# Patient Record
Sex: Female | Born: 1985
Health system: Southern US, Community
[De-identification: ages and names within clinical notes are randomized; demographics above are authoritative.]

## PROBLEM LIST (undated history)

## (undated) DIAGNOSIS — E119 Type 2 diabetes mellitus without complications: Secondary | ICD-10-CM

## (undated) DIAGNOSIS — G510 Bell's palsy: Secondary | ICD-10-CM

## (undated) HISTORY — PX: CHOLECYSTECTOMY: SHX55

---
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
## 2004-01-13 ENCOUNTER — Emergency Department (HOSPITAL_COMMUNITY): Admission: EM | Admit: 2004-01-13 | Discharge: 2004-01-13 | Payer: Self-pay | Admitting: Emergency Medicine

## 2004-05-18 ENCOUNTER — Emergency Department (HOSPITAL_COMMUNITY): Admission: EM | Admit: 2004-05-18 | Discharge: 2004-05-18 | Payer: Self-pay | Admitting: Emergency Medicine

## 2004-06-04 ENCOUNTER — Inpatient Hospital Stay (HOSPITAL_COMMUNITY): Admission: AD | Admit: 2004-06-04 | Discharge: 2004-06-04 | Payer: Self-pay | Admitting: Obstetrics & Gynecology

## 2004-10-06 ENCOUNTER — Ambulatory Visit (HOSPITAL_COMMUNITY): Admission: RE | Admit: 2004-10-06 | Discharge: 2004-10-06 | Payer: Self-pay | Admitting: Cardiology

## 2004-11-25 ENCOUNTER — Inpatient Hospital Stay (HOSPITAL_COMMUNITY): Admission: AD | Admit: 2004-11-25 | Discharge: 2004-11-26 | Payer: Self-pay | Admitting: Obstetrics

## 2005-01-03 ENCOUNTER — Inpatient Hospital Stay (HOSPITAL_COMMUNITY): Admission: AD | Admit: 2005-01-03 | Discharge: 2005-01-03 | Payer: Self-pay | Admitting: Obstetrics

## 2005-01-19 ENCOUNTER — Inpatient Hospital Stay (HOSPITAL_COMMUNITY): Admission: AD | Admit: 2005-01-19 | Discharge: 2005-01-19 | Payer: Self-pay | Admitting: Obstetrics

## 2005-01-23 ENCOUNTER — Inpatient Hospital Stay (HOSPITAL_COMMUNITY): Admission: AD | Admit: 2005-01-23 | Discharge: 2005-01-23 | Payer: Self-pay | Admitting: Obstetrics

## 2005-02-02 ENCOUNTER — Inpatient Hospital Stay (HOSPITAL_COMMUNITY): Admission: AD | Admit: 2005-02-02 | Discharge: 2005-02-02 | Payer: Self-pay | Admitting: Obstetrics

## 2005-02-07 ENCOUNTER — Inpatient Hospital Stay (HOSPITAL_COMMUNITY): Admission: AD | Admit: 2005-02-07 | Discharge: 2005-02-10 | Payer: Self-pay | Admitting: Obstetrics

## 2005-06-01 ENCOUNTER — Emergency Department (HOSPITAL_COMMUNITY): Admission: EM | Admit: 2005-06-01 | Discharge: 2005-06-01 | Payer: Self-pay | Admitting: Emergency Medicine

## 2005-06-30 ENCOUNTER — Inpatient Hospital Stay (HOSPITAL_COMMUNITY): Admission: AD | Admit: 2005-06-30 | Discharge: 2005-06-30 | Payer: Self-pay | Admitting: Obstetrics & Gynecology

## 2005-11-20 ENCOUNTER — Inpatient Hospital Stay (HOSPITAL_COMMUNITY): Admission: AD | Admit: 2005-11-20 | Discharge: 2005-11-20 | Payer: Self-pay | Admitting: Obstetrics

## 2005-11-21 ENCOUNTER — Emergency Department (HOSPITAL_COMMUNITY): Admission: EM | Admit: 2005-11-21 | Discharge: 2005-11-21 | Payer: Self-pay | Admitting: Family Medicine

## 2005-12-19 ENCOUNTER — Inpatient Hospital Stay (HOSPITAL_COMMUNITY): Admission: AD | Admit: 2005-12-19 | Discharge: 2005-12-19 | Payer: Self-pay | Admitting: Obstetrics

## 2006-01-22 ENCOUNTER — Inpatient Hospital Stay (HOSPITAL_COMMUNITY): Admission: AD | Admit: 2006-01-22 | Discharge: 2006-01-22 | Payer: Self-pay | Admitting: Obstetrics

## 2006-02-02 ENCOUNTER — Inpatient Hospital Stay (HOSPITAL_COMMUNITY): Admission: AD | Admit: 2006-02-02 | Discharge: 2006-02-02 | Payer: Self-pay | Admitting: Obstetrics

## 2006-02-08 ENCOUNTER — Inpatient Hospital Stay (HOSPITAL_COMMUNITY): Admission: AD | Admit: 2006-02-08 | Discharge: 2006-02-08 | Payer: Self-pay | Admitting: Obstetrics

## 2006-02-17 ENCOUNTER — Inpatient Hospital Stay (HOSPITAL_COMMUNITY): Admission: AD | Admit: 2006-02-17 | Discharge: 2006-02-17 | Payer: Self-pay | Admitting: Obstetrics

## 2006-02-24 ENCOUNTER — Inpatient Hospital Stay (HOSPITAL_COMMUNITY): Admission: RE | Admit: 2006-02-24 | Discharge: 2006-02-26 | Payer: Self-pay | Admitting: Obstetrics

## 2006-04-07 ENCOUNTER — Emergency Department (HOSPITAL_COMMUNITY): Admission: EM | Admit: 2006-04-07 | Discharge: 2006-04-07 | Payer: Self-pay | Admitting: Emergency Medicine

## 2006-05-28 ENCOUNTER — Emergency Department (HOSPITAL_COMMUNITY): Admission: EM | Admit: 2006-05-28 | Discharge: 2006-05-28 | Payer: Self-pay | Admitting: Emergency Medicine

## 2006-09-25 ENCOUNTER — Emergency Department (HOSPITAL_COMMUNITY): Admission: EM | Admit: 2006-09-25 | Discharge: 2006-09-25 | Payer: Self-pay | Admitting: *Deleted

## 2006-10-19 ENCOUNTER — Encounter: Admission: RE | Admit: 2006-10-19 | Discharge: 2006-11-07 | Payer: Self-pay | Admitting: Sports Medicine

## 2007-04-12 ENCOUNTER — Inpatient Hospital Stay (HOSPITAL_COMMUNITY): Admission: AD | Admit: 2007-04-12 | Discharge: 2007-04-12 | Payer: Self-pay | Admitting: Obstetrics

## 2007-04-25 ENCOUNTER — Inpatient Hospital Stay (HOSPITAL_COMMUNITY): Admission: AD | Admit: 2007-04-25 | Discharge: 2007-04-25 | Payer: Self-pay | Admitting: Obstetrics

## 2007-07-14 ENCOUNTER — Inpatient Hospital Stay (HOSPITAL_COMMUNITY): Admission: AD | Admit: 2007-07-14 | Discharge: 2007-07-14 | Payer: Self-pay | Admitting: Obstetrics

## 2007-11-21 ENCOUNTER — Inpatient Hospital Stay (HOSPITAL_COMMUNITY): Admission: AD | Admit: 2007-11-21 | Discharge: 2007-11-21 | Payer: Self-pay | Admitting: Obstetrics

## 2007-12-13 ENCOUNTER — Inpatient Hospital Stay (HOSPITAL_COMMUNITY): Admission: RE | Admit: 2007-12-13 | Discharge: 2007-12-15 | Payer: Self-pay | Admitting: Obstetrics

## 2008-02-09 ENCOUNTER — Inpatient Hospital Stay (HOSPITAL_COMMUNITY): Admission: AD | Admit: 2008-02-09 | Discharge: 2008-02-09 | Payer: Self-pay | Admitting: Obstetrics

## 2008-07-12 ENCOUNTER — Emergency Department (HOSPITAL_COMMUNITY): Admission: EM | Admit: 2008-07-12 | Discharge: 2008-07-13 | Payer: Self-pay | Admitting: Emergency Medicine

## 2008-12-15 ENCOUNTER — Emergency Department (HOSPITAL_COMMUNITY): Admission: EM | Admit: 2008-12-15 | Discharge: 2008-12-15 | Payer: Self-pay | Admitting: Emergency Medicine

## 2010-06-10 LAB — CBC
HCT: 37.9 % (ref 36.0–46.0)
MCHC: 34.5 g/dL (ref 30.0–36.0)
MCV: 87.2 fL (ref 78.0–100.0)
Platelets: 265 10*3/uL (ref 150–400)
RDW: 12.9 % (ref 11.5–15.5)
WBC: 9.8 10*3/uL (ref 4.0–10.5)

## 2010-06-10 LAB — URINALYSIS, ROUTINE W REFLEX MICROSCOPIC
Glucose, UA: NEGATIVE mg/dL
Hgb urine dipstick: NEGATIVE
Ketones, ur: NEGATIVE mg/dL
Protein, ur: NEGATIVE mg/dL
Urobilinogen, UA: 0.2 mg/dL (ref 0.0–1.0)

## 2010-06-10 LAB — DIFFERENTIAL
Basophils Absolute: 0.2 10*3/uL — ABNORMAL HIGH (ref 0.0–0.1)
Eosinophils Relative: 6 % — ABNORMAL HIGH (ref 0–5)
Lymphocytes Relative: 27 % (ref 12–46)
Lymphs Abs: 2.6 10*3/uL (ref 0.7–4.0)
Monocytes Absolute: 0.7 10*3/uL (ref 0.1–1.0)
Monocytes Relative: 7 % (ref 3–12)
Neutro Abs: 5.7 10*3/uL (ref 1.7–7.7)

## 2010-06-10 LAB — COMPREHENSIVE METABOLIC PANEL
AST: 27 U/L (ref 0–37)
Albumin: 3.8 g/dL (ref 3.5–5.2)
BUN: 12 mg/dL (ref 6–23)
Calcium: 9.3 mg/dL (ref 8.4–10.5)
Chloride: 103 mEq/L (ref 96–112)
Creatinine, Ser: 0.98 mg/dL (ref 0.4–1.2)
GFR calc Af Amer: >60 mL/min (ref >60)
Total Bilirubin: 0.6 mg/dL (ref 0.3–1.2)
Total Protein: 7.1 g/dL (ref 6.0–8.3)

## 2010-06-15 LAB — URINALYSIS, ROUTINE W REFLEX MICROSCOPIC
Glucose, UA: NEGATIVE mg/dL
Hgb urine dipstick: NEGATIVE
Protein, ur: NEGATIVE mg/dL
Specific Gravity, Urine: 1.014 (ref 1.005–1.030)

## 2010-06-15 LAB — COMPREHENSIVE METABOLIC PANEL
Alkaline Phosphatase: 99 U/L (ref 39–117)
BUN: 8 mg/dL (ref 6–23)
Chloride: 105 mEq/L (ref 96–112)
Creatinine, Ser: 0.74 mg/dL (ref 0.4–1.2)
Glucose, Bld: 115 mg/dL — ABNORMAL HIGH (ref 70–99)
Potassium: 4 mEq/L (ref 3.5–5.1)
Total Bilirubin: 0.7 mg/dL (ref 0.3–1.2)

## 2010-06-15 LAB — URINE MICROSCOPIC-ADD ON

## 2010-06-15 LAB — LIPASE, BLOOD: Lipase: 18 U/L (ref 11–59)

## 2010-06-15 LAB — D-DIMER, QUANTITATIVE: D-Dimer, Quant: <0.22 ug/mL-FEU (ref 0.00–0.48)

## 2010-11-26 LAB — URINALYSIS, ROUTINE W REFLEX MICROSCOPIC
Bilirubin Urine: NEGATIVE
Ketones, ur: NEGATIVE
Nitrite: NEGATIVE
Protein, ur: NEGATIVE
Urobilinogen, UA: 0.2

## 2010-12-01 LAB — URINALYSIS, ROUTINE W REFLEX MICROSCOPIC
Bilirubin Urine: NEGATIVE
Glucose, UA: NEGATIVE
Hgb urine dipstick: NEGATIVE
Nitrite: NEGATIVE
Specific Gravity, Urine: 1.015
pH: 5.5

## 2010-12-06 LAB — CBC
HCT: 31.8 — ABNORMAL LOW
HCT: 36
Hemoglobin: 12.1
MCHC: 33.5
MCV: 83.6
MCV: 83.9
Platelets: 208
RBC: 4.31
WBC: 8.4

## 2011-05-02 ENCOUNTER — Encounter (HOSPITAL_BASED_OUTPATIENT_CLINIC_OR_DEPARTMENT_OTHER): Payer: Self-pay

## 2011-05-02 ENCOUNTER — Emergency Department (HOSPITAL_BASED_OUTPATIENT_CLINIC_OR_DEPARTMENT_OTHER)
Admission: EM | Admit: 2011-05-02 | Discharge: 2011-05-02 | Disposition: A | Discharge status: Home / Self Care | Payer: Self-pay | Attending: Emergency Medicine | Admitting: Emergency Medicine

## 2011-05-02 DIAGNOSIS — S90416A Abrasion, unspecified lesser toe(s), initial encounter: Secondary | ICD-10-CM

## 2011-05-02 DIAGNOSIS — IMO0002 Reserved for concepts with insufficient information to code with codable children: Secondary | ICD-10-CM | POA: Insufficient documentation

## 2011-05-02 DIAGNOSIS — Y92009 Unspecified place in unspecified non-institutional (private) residence as the place of occurrence of the external cause: Secondary | ICD-10-CM | POA: Insufficient documentation

## 2011-05-02 DIAGNOSIS — X58XXXA Exposure to other specified factors, initial encounter: Secondary | ICD-10-CM | POA: Insufficient documentation

## 2011-05-02 MED ORDER — HYDROCODONE-ACETAMINOPHEN 5-325 MG PO TABS: 2.0000 | ORAL_TABLET | ORAL | Status: AC | PRN

## 2011-05-02 NOTE — ED Notes (Signed)
rx x 1 for hydrocodone- d/c home with ride

## 2011-05-02 NOTE — ED Notes (Signed)
Injury to 4th toe on right foot.

## 2011-05-02 NOTE — ED Provider Notes (Signed)
AndHistory/physical exam/procedure(s) were performed by non-physician practitioner and as supervising physician I was immediately available for consultation/collaboration. I have reviewed all notes and am in agreement with care and plan.   Hilario Quarry, MD 05/02/11 2139

## 2011-05-02 NOTE — ED Provider Notes (Signed)
History     CSN: [id number]  Arrival date & time 05/02/11  1604   First MD Initiated Contact with Patient 05/02/11 1612      Chief Complaint  Patient presents with  . Toe Injury    (Consider location/radiation/quality/duration/timing/severity/associated sxs/prior treatment) Patient is a 26 y.o. female presenting with foot injury. The history is provided by the patient. No language interpreter was used.  Foot Injury  The incident occurred 6 to 12 hours ago. The incident occurred at home. The injury mechanism was a direct blow. The pain is present in the right toes. The quality of the pain is described as aching. The pain is at a severity of 7/10. The pain has been constant since onset. She reports no foreign bodies present. The symptoms are aggravated by nothing. She has tried nothing for the symptoms. The treatment provided no relief.    History reviewed. No pertinent past medical history.  Past Surgical History  Procedure Date  . Cholecystectomy     No family history on file.  History  Substance Use Topics  . Smoking status: Never Smoker   . Smokeless tobacco: Never Used  . Alcohol Use: Yes     occasional    OB History    Grav Para Term Preterm Abortions TAB SAB Ect Mult Living                  Review of Systems  Skin: Positive for wound.  All other systems reviewed and are negative.    Allergies  Review of patient's allergies indicates no known allergies.  Home Medications  No current outpatient prescriptions on file.  BP 128/77  Pulse 75  Temp(Src) 98.2 F (36.8 C) (Oral)  Resp 16  Ht 5\' 9"  (1.753 m)  Wt 253 lb (114.76 kg)  BMI 37.36 kg/m2  SpO2 100%  LMP 03/27/2011  Physical Exam  Nursing note and vitals reviewed. Constitutional: She is oriented to person, place, and time. She appears well-developed and well-nourished.  HENT:  Head: Normocephalic.  Musculoskeletal: She exhibits tenderness.       Swollen right 4th toe, large bump under toe  nail,  Toenail deformed  Neurological: She is alert and oriented to person, place, and time. She has normal reflexes.  Psychiatric: She has a normal mood and affect.    ED Course  Procedures (including critical care time)  Labs Reviewed - No data to display No results found.   No diagnosis found.  I advised pt to call Dr. Charlsie Merles for evaluation of growth under toe.    MDM        Langston Masker, [place] 05/02/11 [phone]

## 2011-05-02 NOTE — Discharge Instructions (Signed)
Abrasions  Abrasions are skin scrapes. Their treatment depends on how large and deep the abrasion is. Abrasions do not extend through all layers of the skin. A cut or lesion through all skin layers is called a laceration.  HOME CARE INSTRUCTIONS   · If you were given a dressing, change it at least once a day or as instructed by your caregiver. If the bandage sticks, soak it off with a solution of water or hydrogen peroxide.   · Twice a day, wash the area with soap and water to remove all the cream/ointment. You may do this in a sink, under a tub faucet, or in a shower. Rinse off the soap and pat dry with a clean towel. Look for signs of infection (see below).   · Reapply cream/ointment according to your caregiver's instruction. This will help prevent infection and keep the bandage from sticking. Telfa or gauze over the wound and under the dressing or wrap will also help keep the bandage from sticking.   · If the bandage becomes wet, dirty, or develops a foul smell, change it as soon as possible.   · Only take over-the-counter or prescription medicines for pain, discomfort, or fever as directed by your caregiver.   SEEK IMMEDIATE MEDICAL CARE IF:   · Increasing pain in the wound.   · Signs of infection develop: redness, swelling, surrounding area is tender to touch, or pus coming from the wound.   · You have a fever.   · Any foul smell coming from the wound or dressing.   Most skin wounds heal within ten days. Facial wounds heal faster. However, an infection may occur despite proper treatment. You should have the wound checked for signs of infection within 24 to 48 hours or sooner if problems arise. If you were not given a wound-check appointment, look closely at the wound yourself on the second day for early signs of infection listed above.  MAKE SURE YOU:   · Understand these instructions.   · Will watch your condition.   · Will get help right away if you are not doing well or get worse.   Document Released:  12/01/2004 Document Revised: 11/03/2010 Document Reviewed: 10/10/2007  ExitCare® Patient Information ©2012 [employer].

## 2011-06-07 ENCOUNTER — Other Ambulatory Visit (HOSPITAL_COMMUNITY): Payer: Self-pay | Admitting: Obstetrics

## 2011-06-07 DIAGNOSIS — N971 Female infertility of tubal origin: Secondary | ICD-10-CM

## 2011-06-14 ENCOUNTER — Ambulatory Visit (HOSPITAL_COMMUNITY): Payer: Self-pay

## 2013-08-26 ENCOUNTER — Emergency Department (HOSPITAL_BASED_OUTPATIENT_CLINIC_OR_DEPARTMENT_OTHER)
Admission: EM | Admit: 2013-08-26 | Discharge: 2013-08-26 | Disposition: A | Discharge status: Home / Self Care | Payer: BC Managed Care – PPO | Attending: Emergency Medicine | Admitting: Emergency Medicine

## 2013-08-26 ENCOUNTER — Encounter (HOSPITAL_BASED_OUTPATIENT_CLINIC_OR_DEPARTMENT_OTHER): Payer: Self-pay | Admitting: Emergency Medicine

## 2013-08-26 DIAGNOSIS — G510 Bell's palsy: Secondary | ICD-10-CM

## 2013-08-26 DIAGNOSIS — H5789 Other specified disorders of eye and adnexa: Secondary | ICD-10-CM | POA: Insufficient documentation

## 2013-08-26 DIAGNOSIS — Z3202 Encounter for pregnancy test, result negative: Secondary | ICD-10-CM | POA: Insufficient documentation

## 2013-08-26 LAB — PREGNANCY, URINE: Preg Test, Ur: NEGATIVE

## 2013-08-26 MED ORDER — VALACYCLOVIR HCL 1 G PO TABS: 1000.0000 mg | ORAL_TABLET | Freq: Three times a day (TID) | ORAL | Status: AC

## 2013-08-26 NOTE — ED Provider Notes (Signed)
CSN: [id number]     Arrival date & time 08/26/13  2233 History  This chart was scribed for Enid SkeensJoshua M Zavitz, MD by Phillis HaggisGabriella Gaje, ED Scribe. This patient was seen in room MH07/MH07 and patient care was started at 11:13 PM.     Chief Complaint  Patient presents with  . Facial numbness    The history is provided by the patient. No language interpreter was used.  HPI Comments: Kaitlyn Zimmerman is a 28 y.o. female who presents to the Emergency Department complaining of left facial numbness with teary eye drainage onset 24 hours ago. She states that she has had a hard time opening her mouth and has experienced blurry vision in her left eye. She states that she has never had this problem before and denies any other medical history. She denies any new medications. Patient denies abdominal pain, chest pain, vomiting, diarrhea,  headache, hearing issues, difficulty swallowing, LE weakness or numbness, or urinary symptoms. She states that she does not smoke and drinks occasionally.  History reviewed. No pertinent past medical history. Past Surgical History  Procedure Laterality Date  . Cholecystectomy     No family history on file. History  Substance Use Topics  . Smoking status: Never Smoker   . Smokeless tobacco: Never Used  . Alcohol Use: Yes     Comment: occasional   OB History   Grav Para Term Preterm Abortions TAB SAB Ect Mult Living                 Review of Systems  HENT: Negative for hearing loss.   Eyes: Positive for discharge (teary discharge left eye) and visual disturbance (blurry).  Cardiovascular: Negative for chest pain.  Gastrointestinal: Negative for vomiting, abdominal pain and diarrhea.  Genitourinary: Negative for dysuria, urgency, frequency, hematuria and decreased urine volume.  Neurological: Positive for numbness (left side of face). Negative for facial asymmetry, weakness and headaches.  All other systems reviewed and are negative.     Allergies  Review of  patient's allergies indicates no known allergies.  Home Medications   Prior to Admission medications   Not on File   BP 135/78  Pulse 98  Temp(Src) 98.4 F (36.9 C) (Oral)  Resp 20  Ht 5\' 8"  (1.727 m)  Wt 247 lb (112.038 kg)  BMI 37.56 kg/m2  SpO2 96%  LMP 04/28/2013 Physical Exam  Nursing note and vitals reviewed. Constitutional: She is oriented to person, place, and time. She appears well-developed and well-nourished.  HENT:  Head: Normocephalic and atraumatic.  Mouth/Throat: Uvula is midline, oropharynx is clear and moist and mucous membranes are normal.  Posterior pharynx symmetric Tongue is midline  Eyes: Conjunctivae and EOM are normal. Pupils are equal, round, and reactive to light. No scleral icterus.  Mild tearing of the left eye Visual fields intact in both eyes  Neck: Normal range of motion. Neck supple.  Cardiovascular: Normal rate, regular rhythm and normal heart sounds.   Pulmonary/Chest: Effort normal and breath sounds normal.  Abdominal: Soft. There is no tenderness.  Musculoskeletal: Normal range of motion.  Neurological: She is alert and oriented to person, place, and time. She has normal strength. No cranial nerve deficit or sensory deficit. She displays a negative Romberg sign. Gait normal.  Decreased ability to wrinkle left eyebrow Slight left facial droop Mild decreased ability to close left eyelid No pronator drift Finger and nose intact bilateral Equal strength lower extremities bilateral Equal sensation lower extremities bilateral  Skin: Skin is warm  and dry.  Psychiatric: She has a normal mood and affect. Her behavior is normal.    ED Course  Procedures (including critical care time) DIAGNOSTIC STUDIES: Oxygen Saturation is 96% on room air, normal by my interpretation.    COORDINATION OF CARE: 11:20 PM-Discussed treatment plan which includes f/u with PCP with pt at bedside and pt agreed to plan.     Labs Review Labs Reviewed   PREGNANCY, URINE    Imaging Review No results found.   EKG Interpretation None      MDM   Final diagnoses:  None  Bells Palsy I personally performed the services described in this documentation, which was scribed in my presence. The recorded information has been reviewed and is accurate.  Clinically Bell's palsy. Patient has very subtle findings with decreased racing of left eyebrow, mild facial droop and tearing of the left side. No other neurologic findings. Patient held otherwise no stroke risk factors. Followup outpatient and reasons to return discussed. Antiviral treatment started.  Results and differential diagnosis were discussed with the patient/parent/guardian. Close follow up outpatient was discussed, comfortable with the plan.   Medications - No data to display  Filed Vitals:   08/26/13 2242 08/26/13 2310  BP: 135/78   Pulse: 98   Temp: 98.4 F (36.9 C) 98.3 F (36.8 C)  TempSrc: Oral   Resp: 20   Height: 5\' 8"  (1.727 m)   Weight: 247 lb (112.038 kg)   SpO2: 96%        Enid SkeensJoshua M Zavitz, MD 08/27/13 [phone]

## 2013-08-26 NOTE — ED Notes (Signed)
MD at bedside. 

## 2013-08-26 NOTE — ED Notes (Signed)
Left side of her face has been numb since last night. She is having difficulty closing her left eye. Able to speak but feels numb around her lips. No deficit in her arms or legs.

## 2013-08-26 NOTE — Discharge Instructions (Signed)
Use saline eyedrops throughout the day and night. Take antiviral medicines as discussed. If you develop any weakness or numbness in the arms or legs, new headache, balance difficulties or worsening or new symptoms please see a physician or come to the ER.  If you were given medicines take as directed.  If you are on coumadin or contraceptives realize their levels and effectiveness is altered by many different medicines.  If you have any reaction (rash, tongues swelling, other) to the medicines stop taking and see a physician.   Please follow up as directed and return to the ER or see a physician for new or worsening symptoms.  Thank you. Filed Vitals:   08/26/13 2242 08/26/13 2310  BP: 135/78   Pulse: 98   Temp: 98.4 F (36.9 C) 98.3 F (36.8 C)  TempSrc: Oral   Resp: 20   Height: 5\' 8"  (1.727 m)   Weight: 247 lb (112.038 kg)   SpO2: 96%

## 2013-09-01 ENCOUNTER — Encounter (HOSPITAL_BASED_OUTPATIENT_CLINIC_OR_DEPARTMENT_OTHER): Payer: Self-pay | Admitting: Emergency Medicine

## 2013-09-01 ENCOUNTER — Emergency Department (HOSPITAL_BASED_OUTPATIENT_CLINIC_OR_DEPARTMENT_OTHER)
Admission: EM | Admit: 2013-09-01 | Discharge: 2013-09-01 | Disposition: A | Discharge status: Home / Self Care | Payer: BC Managed Care – PPO | Attending: Emergency Medicine | Admitting: Emergency Medicine

## 2013-09-01 DIAGNOSIS — G510 Bell's palsy: Secondary | ICD-10-CM | POA: Insufficient documentation

## 2013-09-01 DIAGNOSIS — Z79899 Other long term (current) drug therapy: Secondary | ICD-10-CM | POA: Insufficient documentation

## 2013-09-01 DIAGNOSIS — G518 Other disorders of facial nerve: Secondary | ICD-10-CM | POA: Insufficient documentation

## 2013-09-01 HISTORY — DX: Bell's palsy: G51.0

## 2013-09-01 MED ORDER — PREDNISONE 20 MG PO TABS: ORAL_TABLET | ORAL | Status: DC

## 2013-09-01 MED ORDER — OXYCODONE-ACETAMINOPHEN 5-325 MG PO TABS
2.0000 | ORAL_TABLET | Freq: Once | ORAL | Status: AC
  Administered 2013-09-01: 2 via ORAL
  Filled 2013-09-01: qty 2

## 2013-09-01 MED ORDER — OXYCODONE-ACETAMINOPHEN 5-325 MG PO TABS: 2.0000 | ORAL_TABLET | ORAL | Status: DC | PRN

## 2013-09-01 MED ORDER — PREDNISONE 50 MG PO TABS
60.0000 mg | ORAL_TABLET | Freq: Once | ORAL | Status: AC
  Administered 2013-09-01: 60 mg via ORAL
  Filled 2013-09-01 (×2): qty 1

## 2013-09-01 NOTE — ED Provider Notes (Signed)
CSN: [id number]     Arrival date & time 09/01/13  [id number] History   First MD Initiated Contact with Patient 09/01/13 [phone]     Chief Complaint  Patient presents with  . Facial Pain     (Consider location/radiation/quality/duration/timing/severity/associated sxs/prior Treatment) HPI [age] year old female who was seen here on 2/22 with a new onset of Bell's palsy. She was treated with valacyclovir and is continuing the medication. She has continued to have left-sided facial weakness consistent with Bell's palsy. Last night she began having pain in the left cheek and jaw area. She has not noted any increased swelling, redness, or for. She continues to have the same symptoms that she is had with the Bell's palsy which included left-sided facial weakness, eye blurring and watering, and some drooling and difficulty speaking and eating do to this. She denies any trauma, fever, or recent infections, difficulty swallowing, or chills. She has not had any weakness or numbness in an arm or leg or double vision or difficulty walking.  Past Medical History  Diagnosis Date  . Bell's palsy    Past Surgical History  Procedure Laterality Date  . Cholecystectomy     No family history on file. History  Substance Use Topics  . Smoking status: Never Smoker   . Smokeless tobacco: Never Used  . Alcohol Use: Yes     Comment: occasional   OB History   Grav Para Term Preterm Abortions TAB SAB Ect Mult Living                 Review of Systems  All other systems reviewed and are negative.     Allergies  Review of patient's allergies indicates no known allergies.  Home Medications   Prior to Admission medications   Medication Sig Start Date End Date Taking? Authorizing Provider  valACYclovir (VALTREX) 1000 MG tablet Take 1 tablet (1,000 mg total) by mouth 3 (three) times daily. 08/26/13 09/02/13  Enid SkeensJoshua M Zavitz, MD   BP 134/80  Pulse 80  Temp(Src) 98.7 F (37.1 C) (Oral)  Resp 18  Ht 5\' 8"  (1.727 m)   Wt 247 lb (112.038 kg)  BMI 37.56 kg/m2  SpO2 100%  LMP 04/28/2013 Physical Exam  Nursing note and vitals reviewed. Constitutional: She is oriented to person, place, and time. She appears well-developed and well-nourished.  HENT:  Head: Normocephalic and atraumatic.  Right Ear: Tympanic membrane and external ear normal.  Left Ear: Tympanic membrane and external ear normal.  Nose: Nose normal. Right sinus exhibits no maxillary sinus tenderness and no frontal sinus tenderness. Left sinus exhibits no maxillary sinus tenderness and no frontal sinus tenderness.  Mouth/Throat: Oropharynx is clear and moist.  Left facial droop inclusive of forehead  Eyes: Conjunctivae and EOM are normal. Pupils are equal, round, and reactive to light. Right eye exhibits no nystagmus. Left eye exhibits no nystagmus.  Left conjunctiva is injected  Neck: Normal range of motion. Neck supple.  Cardiovascular: Normal rate, regular rhythm, normal heart sounds and intact distal pulses.   Pulmonary/Chest: Effort normal and breath sounds normal. No respiratory distress. She exhibits no tenderness.  Abdominal: Soft. Bowel sounds are normal. She exhibits no distension and no mass. There is no tenderness.  Musculoskeletal: Normal range of motion. She exhibits no edema and no tenderness.  Neurological: She is alert and oriented to person, place, and time. She has normal strength and normal reflexes. She displays normal reflexes. No sensory deficit. She exhibits normal muscle tone. She displays a negative Romberg  sign. Coordination normal. GCS eye subscore is 4. GCS verbal subscore is 5. GCS motor subscore is 6.  Reflex Scores:      Tricep reflexes are 2+ on the right side and 2+ on the left side.      Bicep reflexes are 2+ on the right side and 2+ on the left side.      Brachioradialis reflexes are 2+ on the right side and 2+ on the left side.      Patellar reflexes are 2+ on the right side and 2+ on the left side.       Achilles reflexes are 2+ on the right side and 2+ on the left side. Patient with normal gait without ataxia, shuffling, spasm, or antalgia. Speech is normal without dysarthria, dysphasia, or aphasia. Muscle strength is 5/5 in bilateral shoulders, elbow flexor and extensors, wrist flexor and extensors, and intrinsic hand muscles. 5/5 bilateral lower extremity hip flexors, extensors, knee flexors and extensors, and ankle dorsi and plantar flexors.    Skin: Skin is warm and dry. No rash noted.  Psychiatric: She has a normal mood and affect. Her behavior is normal. Judgment and thought content normal.    ED Course  Procedures (including critical care time) Labs Review Labs Reviewed - No data to display  Imaging Review No results found.   EKG Interpretation None      MDM   Final diagnoses:  Bell's palsy  Facial neuralgia    Patient with pain associated with Bell's palsy. She has not have any rash consistent with Ramsay Hunt syndrome and does not have any lesions of the tympanic membrane, eyes, or nodes noted. Pain is atypical in association with Bell's palsy but can represent fifth cranial nerve irritation. She will continue her acyclovir or, be treated with pain medicine, and be placed on prednisone for possible irritation of the nerve through the canal. She will be followed up with neurology. She was previously given a referral and was told to wait and should finish her medication. Given the change in her symptoms it'll be appropriate for her to call and be followed up this week. I have instructed her that if there is any worsening of her symptoms that she should return to the ED and imaging may be appropriate at that time. We specifically discussed vision changes, hearing problems, difficulty swallowing, or lateralized weakness.  I have also discussed eye care with her if there is some injection of the conjunctiva on the left although that does not appear to be painful to her. She voices  understanding of her term depression and need for followup.    Hilario Quarryanielle S Ray, MD 09/02/13 [phone]

## 2013-09-01 NOTE — ED Notes (Signed)
Was seen and treated on 6/22 for Bell's Palsy. States this morning she woke up with a sharp pain from her left neck that radiates up and behind her left eye/ temporal area. Patient states that it had not been hurting until this morning. She still has some obvious facial drooping and stated numbness.

## 2013-09-01 NOTE — Discharge Instructions (Signed)
Bell's Palsy Bell's palsy is a condition in which one side of the face becomes temporarily weak or paralyzed. Most of the time no cause is found. A viral infection of the facial nerve is the most commonly suspected cause. The condition almost always clears up in a few weeks to months. However, in a small number of people, the weakness can be permanent. Sometimes steroid medicines and antiviral medicines are prescribed to improve recovery time. Blood and other tests are usually not needed, but they may be performed at your caregiver's discretion, to rule out other causes. Careful follow up is importantto be sure the facial nerve is recovering. Because facial weakness can make it hard to blink, it is important to prevent drying of the eye. Artificial tears are often prescribed to keep the eye lubricated. Glasses or an eye patch should be worn to protect the eye, if you cannot close your eye completely. If the eye is not protected, permanent damage can be done to the cornea (clear covering over your eye). Sometimes facial massage and exercises help weakened muscles recover.  SEEK IMMEDIATE MEDICAL CARE IF:   You have increased weakness, earache, headache, or dizziness.  You develop new problems or symptoms, or the area of weakness or paralysis extends beyond the face.  You feel you are getting worse. Document Released: 03/31/2004 Document Revised: 05/16/2011 Document Reviewed: 03/02/2009 [facility] Patient Information 2015 [employer], [place]. This information is not intended to replace advice given to you by your health care provider. Make sure you discuss any questions you have with your health care provider. Trigeminal Neuralgia Trigeminal neuralgia is a nerve disorder that causes sudden attacks of severe facial pain. It is caused by damage to the trigeminal nerve, a major nerve in the face. It is more common in women and in the elderly, although it can also happen in younger patients. Attacks last from a few  seconds to several minutes and can occur from a couple of times per year to several times per day. Trigeminal neuralgia can be a very distressing and disabling condition. Surgery may be needed in very severe cases if medical treatment does not give relief. HOME CARE INSTRUCTIONS   If your caregiver prescribed medication to help prevent attacks, take as directed.  To help prevent attacks:  Chew on the unaffected side of the mouth.  Avoid touching your face.  Avoid blasts of hot or cold air.  Men may wish to grow a beard to avoid having to shave. SEEK IMMEDIATE MEDICAL CARE IF:  Pain is unbearable and your medicine does not help.  You develop new, unexplained symptoms (problems).  You have problems that may be related to a medication you are taking. Document Released: 02/19/2000 Document Revised: 05/16/2011 Document Reviewed: 12/19/2008 [facility]ExitCare Patient Information 2015 [place], [place]. This information is not intended to replace advice given to you by your health care provider. Make sure you discuss any questions you have with your health care provider.

## 2013-09-09 ENCOUNTER — Ambulatory Visit (INDEPENDENT_AMBULATORY_CARE_PROVIDER_SITE_OTHER): Payer: BC Managed Care – PPO | Admitting: Neurology

## 2013-09-09 ENCOUNTER — Encounter: Payer: Self-pay | Admitting: Neurology

## 2013-09-09 ENCOUNTER — Encounter: Payer: Self-pay | Admitting: *Deleted

## 2013-09-09 VITALS — BP 108/60 | HR 76 | Resp 18 | Ht 68.0 in | Wt 268.0 lb

## 2013-09-09 DIAGNOSIS — R51 Headache: Secondary | ICD-10-CM

## 2013-09-09 DIAGNOSIS — G510 Bell's palsy: Secondary | ICD-10-CM

## 2013-09-09 DIAGNOSIS — R519 Headache, unspecified: Secondary | ICD-10-CM

## 2013-09-09 MED ORDER — GABAPENTIN 300 MG PO CAPS: ORAL_CAPSULE | ORAL | Status: DC

## 2013-09-09 NOTE — Progress Notes (Signed)
NEUROLOGY CONSULTATION NOTE  Kaitlyn Zimmerman MRN: [id number] DOB: Jan 01, 1986  Referring provider: Dr. Margarita Grizzleanielle Ray Primary care provider: none  Reason for consult:  Bell's palsy with facial pain  Dear Dr Rosalia Hammersay:  Thank you for your kind referral of Kaitlyn Zimmerman for consultation of the above symptoms. Although her history is well known to you, please allow me to reiterate it for the purpose of our medical record. The patient was accompanied to the clinic by her friend who also provides collateral information. Records and images were personally reviewed where available.  HISTORY OF PRESENT ILLNESS: This is a 28 year old right-handed woman with no significant past medical history, in her usual state of health until 08/25/2013 when she noted left facial weakness and numbness after coming home for work. She started having tearing in the left eye and [facility] where she was diagnosed with left Bell's palsy and discharged home on 7 days of Valtrex.  She went back to the ER 6 days later due to worsening of left facial weakness, with drooling when she would drink liquids, as well as shooting pain radiating from the left temporal region down to her neck.  Pain would be constant for days, then would resolved for a few days, however for the past 7 days she has had a grinding headache over the left retro-orbital region, similar to her prior migraines with associated phonophobia.  No associated nausea, vomiting, focal numbness/tingling/weakness.  She was given a 5-day course of Prednisone and Percocet which would make her drowsy but did not help much with the pain.  She feels the facial weakness continues to worsen, with more drooling and incomplete eye closure.  She is not tearing as much.  Her vision blurs occasionally, improving with eye drops.  She reports pain inside her left ear, with hyperacusis and echoes in her left ear.  There is occasional tinnitus in the left ear.  She denies any changes in  sense of taste.  She denies any prior similar symptoms, no recent infections.  She has a history of migraines from ages [age] to [age].  She denies any dizziness, diplopia, dysarthria, dysphagia, back pain, bowel/bladder dysfunction. There is no family history of similar symptoms.  PAST MEDICAL HISTORY: Past Medical History  Diagnosis Date  . Bell's palsy     PAST SURGICAL HISTORY: Past Surgical History  Procedure Laterality Date  . Cholecystectomy      MEDICATIONS: No current outpatient prescriptions on file prior to visit.   No current facility-administered medications on file prior to visit.    ALLERGIES: No Known Allergies  FAMILY HISTORY: Family History  Problem Relation Age of Onset  . Adopted: Yes    SOCIAL HISTORY: History   Social History  . Marital Status: Divorced    Spouse Name: N/A    Number of Children: N/A  . Years of Education: N/A   Occupational History  . Not on file.   Social History Main Topics  . Smoking status: Never Smoker   . Smokeless tobacco: Never Used  . Alcohol Use: Yes     Comment: occasional  . Drug Use: No  . Sexual Activity: Not on file   Other Topics Concern  . Not on file   Social History Narrative  . No narrative on file    REVIEW OF SYSTEMS: Constitutional: No fevers, chills, or sweats, no generalized fatigue, change in appetite Eyes: as above Ear, nose and throat: No hearing loss, ear pain, nasal  congestion, sore throat Cardiovascular: No chest pain, palpitations Respiratory:  No shortness of breath at rest or with exertion, wheezes GastrointestinaI: No nausea, vomiting, diarrhea, abdominal pain, fecal incontinence Genitourinary:  No dysuria, urinary retention or frequency Musculoskeletal:  + neck pain, no back pain Integumentary: No rash, pruritus, skin lesions Neurological: as above Psychiatric: No depression, insomnia, anxiety Endocrine: No palpitations, fatigue, diaphoresis, mood swings, change in appetite,  change in weight, increased thirst Hematologic/Lymphatic:  No anemia, purpura, petechiae. Allergic/Immunologic: no itchy/runny eyes, nasal congestion, recent allergic reactions, rashes  PHYSICAL EXAM: Filed Vitals:   09/09/13 1242  BP: 108/60  Pulse: 76  Resp: 18   General: No acute distress HEENT:  Normocephalic/atraumatic, no vesicles in ear canals, intact TM bilaterally Eyes: Fundoscopic exam shows bilateral sharp discs, no vessel changes, exudates, or hemorrhages Neck: supple, no paraspinal tenderness, full range of motion Back: No paraspinal tenderness Heart: regular rate and rhythm Lungs: Clear to auscultation bilaterally. Vascular: No carotid bruits. Skin/Extremities: No rash, no edema Neurological Exam: Mental status: alert and oriented to person, place, and time, no dysarthria or aphasia, Fund of knowledge is appropriate.  Recent and remote memory are intact.  Attention and concentration are normal.    Able to name objects and repeat phrases. Cranial nerves: CN I: not tested CN II: pupils equal, round and reactive to light, visual fields intact, fundi unremarkable. CN III, IV, VI:  full range of motion, no nystagmus, no ptosis CN V: decreased pin and cold on left V2-3 distribution CN VII:  Unable to wrinkle forehead on left, incomplete eye closure, asymmetric smile (House Brackmann grade IV moderately severe) CN VIII: hearing intact to finger rub CN IX, X: gag intact, uvula midline CN XI: sternocleidomastoid and trapezius muscles intact CN XII: tongue midline Bulk & Tone: normal, no fasciculations. Motor: 5/5 throughout with no pronator drift. Sensation: intact to light touch, cold, pin, vibration and joint position sense.  No extinction to double simultaneous stimulation.  Romberg test negative Deep Tendon Reflexes: +2 throughout, no ankle clonus Plantar responses: downgoing bilaterally Cerebellar: no incoordination on finger to nose, heel to shin. No  dysdiadochokinesia Gait: narrow-based and steady, able to tandem walk adequately. Tremor: none  IMPRESSION: This is a 28 year old right-handed woman with left Bell's palsy since 08/25/2013.  She went to the ER and received antiviral treatment within 24 hours, steroids were started after her second ER visit 6 days later.  She reports worsening of left facial weakness, now with associated left facial pain and headache.  Her exam shows decreased sensation in the left V2-3 distribution, concerning for 5th cranial nerve involvement.  MRI brain with and without contrast will be ordered to assess for underlying structural abnormality. Bloodwork for ACE level and Lyme Ab will be ordered. We discussed the importance of eye care in Bell's palsy, she will continue using eye drops and an eye patch at night, and will be referred to Ophthalmology. We discussed symptomatic treatment of facial pain with membrane stabilizing agents, she will start gabapentin with uptitration as tolerated.  Side effects were discussed. She will follow-up in 1 month or earlier if needed.  Thank you for allowing me to participate in the care of this patient. Please do not hesitate to call for any questions or concerns.   Patrcia DollyKaren Jams Trickett, M.D.

## 2013-09-09 NOTE — Patient Instructions (Addendum)
1. Bloodwork for creatinine, ACE level, Lyme Ab 2. MRI brain with and without contrast with thin cuts through IACS 3. Start gabapentin 300mg : Take 1 cap at bedtime for 1 week, then increase to 2 caps at bedtime 4. Use eye patch when sleeping 5. Ophtho referral

## 2013-09-10 LAB — ANGIOTENSIN CONVERTING ENZYME: ANGIOTENSIN-CONVERTING ENZYME: 25 U/L (ref 8–52)

## 2013-09-10 LAB — B. BURGDORFI ANTIBODIES: B BURGDORFERI AB IGG+ IGM: 0.36 {ISR}

## 2013-09-10 LAB — CREATININE, SERUM: Creat: 0.72 mg/dL (ref 0.50–1.10)

## 2013-09-11 ENCOUNTER — Telehealth: Payer: Self-pay | Admitting: Family Medicine

## 2013-09-11 NOTE — Telephone Encounter (Signed)
Message copied by Franciso BendMCNEIL, Rolonda Pontarelli M on Wed Sep 11, 2013  3:41 PM ------      Message from: Van ClinesAQUINO, KAREN M      Created: Wed Sep 11, 2013  8:04 AM       Pls let her know bloodwork normal, thanks ------

## 2013-09-11 NOTE — Telephone Encounter (Signed)
Message copied by Franciso BendMCNEIL, Monserrate Blaschke M on Wed Sep 11, 2013  1:13 PM ------      Message from: Van ClinesAQUINO, KAREN M      Created: Wed Sep 11, 2013  8:04 AM       Pls let her know bloodwork normal, thanks ------

## 2013-09-11 NOTE — Telephone Encounter (Signed)
Tried calling patient, couldn't leave msg voicemail box is full. Will try again later.

## 2013-09-11 NOTE — Telephone Encounter (Signed)
Tried calling patient couldn't leave msg mailbox was full. Will try again later.

## 2013-09-16 ENCOUNTER — Telehealth: Payer: Self-pay | Admitting: Neurology

## 2013-09-16 NOTE — Telephone Encounter (Signed)
Pt called/returning your call at 12:47PM C/b [phone]

## 2013-09-17 NOTE — Telephone Encounter (Signed)
Returned patient's call. Notified of normal lab results.

## 2013-09-23 ENCOUNTER — Ambulatory Visit (HOSPITAL_COMMUNITY): Payer: BC Managed Care – PPO

## 2013-10-14 ENCOUNTER — Ambulatory Visit: Payer: BC Managed Care – PPO | Admitting: Neurology

## 2013-10-14 ENCOUNTER — Telehealth: Payer: Self-pay | Admitting: Neurology

## 2013-10-14 NOTE — Telephone Encounter (Signed)
Pt no showed 10/14/13 appt w/ Dr. Karel JarvisAquino. No show letter mailed to pt / Sherri S.

## 2014-04-21 ENCOUNTER — Encounter (HOSPITAL_BASED_OUTPATIENT_CLINIC_OR_DEPARTMENT_OTHER): Payer: Self-pay | Admitting: *Deleted

## 2014-04-21 ENCOUNTER — Emergency Department (HOSPITAL_BASED_OUTPATIENT_CLINIC_OR_DEPARTMENT_OTHER)
Admission: EM | Admit: 2014-04-21 | Discharge: 2014-04-21 | Disposition: A | Discharge status: Home / Self Care | Payer: BLUE CROSS/BLUE SHIELD | Attending: Emergency Medicine | Admitting: Emergency Medicine

## 2014-04-21 ENCOUNTER — Emergency Department (HOSPITAL_BASED_OUTPATIENT_CLINIC_OR_DEPARTMENT_OTHER): Payer: BLUE CROSS/BLUE SHIELD

## 2014-04-21 DIAGNOSIS — M25462 Effusion, left knee: Secondary | ICD-10-CM

## 2014-04-21 DIAGNOSIS — S8992XA Unspecified injury of left lower leg, initial encounter: Secondary | ICD-10-CM | POA: Diagnosis not present

## 2014-04-21 DIAGNOSIS — W009XXA Unspecified fall due to ice and snow, initial encounter: Secondary | ICD-10-CM | POA: Diagnosis not present

## 2014-04-21 DIAGNOSIS — Z8669 Personal history of other diseases of the nervous system and sense organs: Secondary | ICD-10-CM | POA: Insufficient documentation

## 2014-04-21 DIAGNOSIS — Y998 Other external cause status: Secondary | ICD-10-CM | POA: Insufficient documentation

## 2014-04-21 DIAGNOSIS — Y9289 Other specified places as the place of occurrence of the external cause: Secondary | ICD-10-CM | POA: Insufficient documentation

## 2014-04-21 DIAGNOSIS — Y9389 Activity, other specified: Secondary | ICD-10-CM | POA: Insufficient documentation

## 2014-04-21 MED ORDER — HYDROCODONE-ACETAMINOPHEN 5-325 MG PO TABS: 2.0000 | ORAL_TABLET | ORAL | Status: DC | PRN

## 2014-04-21 NOTE — Discharge Instructions (Signed)

## 2014-04-21 NOTE — ED Provider Notes (Signed)
CSN: [id number]     Arrival date & time 04/21/14  1500 History   First MD Initiated Contact with Patient 04/21/14 1513     Chief Complaint  Patient presents with  . Fall  . Knee Injury     (Consider location/radiation/quality/duration/timing/severity/associated sxs/prior Treatment) Patient is a 29 y.o. female presenting with fall. The history is provided by the patient. No language interpreter was used.  Fall This is a new problem. The current episode started today. The problem occurs constantly. The problem has been gradually worsening. Associated symptoms include joint swelling and myalgias. Nothing aggravates the symptoms. She has tried nothing for the symptoms. The treatment provided moderate relief.    Past Medical History  Diagnosis Date  . Bell's palsy    Past Surgical History  Procedure Laterality Date  . Cholecystectomy     Family History  Problem Relation Age of Onset  . Adopted: Yes   History  Substance Use Topics  . Smoking status: Never Smoker   . Smokeless tobacco: Never Used  . Alcohol Use: Yes     Comment: occasional   OB History    No data available     Review of Systems  Musculoskeletal: Positive for myalgias and joint swelling.  All other systems reviewed and are negative.     Allergies  Review of patient's allergies indicates no known allergies.  Home Medications   Prior to Admission medications   Medication Sig Start Date End Date Taking? Authorizing Provider  gabapentin (NEURONTIN) 300 MG capsule Take 1 cap at bedtime for 1 week, then increase to 2 caps at bedtime 09/09/13   Van ClinesKaren M Aquino, MD   BP 117/72 mmHg  Pulse 94  Temp(Src) 98.2 F (36.8 C) (Oral)  Resp 18  Ht 5\' 8"  (1.727 m)  Wt 268 lb (121.564 kg)  BMI 40.76 kg/m2  SpO2 99%  LMP 03/26/2014 Physical Exam  Constitutional: She appears well-developed and well-nourished.  Cardiovascular: Normal rate and normal heart sounds.   Pulmonary/Chest: Effort normal.  Musculoskeletal:  She exhibits tenderness.  Swollen tender left leg,  Decreased range of motion    Neurological: She is alert.  Skin: Skin is warm.  Psychiatric: She has a normal mood and affect.  Nursing note and vitals reviewed.   ED Course  Procedures (including critical care time) Labs Review Labs Reviewed - No data to display  Imaging Review Dg Knee Complete 4 Views Left  04/21/2014   CLINICAL DATA:  Status post fall down stairs with subsequent knee discomfort  EXAM: LEFT KNEE - COMPLETE 4+ VIEW  COMPARISON:  Left knee series of September 25, 2006  FINDINGS: The bones are adequately mineralized. There is no acute fracture nor dislocation. There is beaking of the tibial spines. The joint spaces are reasonably well maintained. There is a small spur from the periphery of the lateral tibial plateau. There is soft tissue swelling in the prepatellar region. There is a small suprapatellar effusion.  IMPRESSION: There is mild osteoarthritic change of the left knee. There is no acute fracture. There is prepatellar edema and a small suprapatellar effusion.   Electronically Signed   By: David  SwazilandJordan   On: 04/21/2014 15:50     EKG Interpretation None      MDM   Final diagnoses:  Knee effusion, left    Knee imbolizer Crutches Hydrocodone Schedule to see Dr. Despina Hickalusio for evaluation.    Lonia SkinnerLeslie K Sneads FerrySofia, PA-C 04/21/14 1613  Geoffery Lyonsouglas Delo, MD 04/21/14 [phone]

## 2014-04-21 NOTE — ED Notes (Signed)
Pt refused crutches.

## 2014-04-21 NOTE — ED Notes (Signed)
Slipped on ice and fell. Pain in her left knee.

## 2014-07-20 ENCOUNTER — Encounter (HOSPITAL_BASED_OUTPATIENT_CLINIC_OR_DEPARTMENT_OTHER): Payer: Self-pay | Admitting: *Deleted

## 2014-07-20 ENCOUNTER — Emergency Department (HOSPITAL_BASED_OUTPATIENT_CLINIC_OR_DEPARTMENT_OTHER): Payer: BLUE CROSS/BLUE SHIELD

## 2014-07-20 ENCOUNTER — Emergency Department (HOSPITAL_BASED_OUTPATIENT_CLINIC_OR_DEPARTMENT_OTHER)
Admission: EM | Admit: 2014-07-20 | Discharge: 2014-07-20 | Disposition: A | Discharge status: Home / Self Care | Payer: Self-pay | Attending: Emergency Medicine | Admitting: Emergency Medicine

## 2014-07-20 DIAGNOSIS — Z8669 Personal history of other diseases of the nervous system and sense organs: Secondary | ICD-10-CM | POA: Insufficient documentation

## 2014-07-20 DIAGNOSIS — R059 Cough, unspecified: Secondary | ICD-10-CM

## 2014-07-20 DIAGNOSIS — R05 Cough: Secondary | ICD-10-CM

## 2014-07-20 DIAGNOSIS — J069 Acute upper respiratory infection, unspecified: Secondary | ICD-10-CM | POA: Insufficient documentation

## 2014-07-20 DIAGNOSIS — J039 Acute tonsillitis, unspecified: Secondary | ICD-10-CM | POA: Insufficient documentation

## 2014-07-20 LAB — RAPID STREP SCREEN (MED CTR MEBANE ONLY): STREPTOCOCCUS, GROUP A SCREEN (DIRECT): NEGATIVE

## 2014-07-20 MED ORDER — BENZONATATE 100 MG PO CAPS: 100.0000 mg | ORAL_CAPSULE | Freq: Three times a day (TID) | ORAL | Status: DC | PRN

## 2014-07-20 MED ORDER — IBUPROFEN 400 MG PO TABS
600.0000 mg | ORAL_TABLET | Freq: Once | ORAL | Status: AC
  Administered 2014-07-20: 600 mg via ORAL
  Filled 2014-07-20 (×2): qty 1

## 2014-07-20 MED ORDER — IBUPROFEN 600 MG PO TABS: 600.0000 mg | ORAL_TABLET | Freq: Four times a day (QID) | ORAL | Status: DC | PRN

## 2014-07-20 NOTE — ED Notes (Signed)
C/o cold sx, cough (non-productive), fever, sore throat, voice hoarse, stress incontinence with coughing.  (Denies: nvd, sob or dizziness), onset Thursday. Has tried alka-seltzer, theraflu and tylenol cold w/o relief. Last tylenol at 0130. Alert, NAD, calm, interactive, voice hoarse, no dyspnea noted. Denies sick contacts.

## 2014-07-20 NOTE — Discharge Instructions (Signed)
Upper Respiratory Infection, Adult °An upper respiratory infection (URI) is also sometimes known as the common cold. The upper respiratory tract includes the nose, sinuses, throat, trachea, and bronchi. Bronchi are the airways leading to the lungs. Most people improve within 1 week, but symptoms can last up to 2 weeks. A residual cough may last even longer.  °CAUSES °Many different viruses can infect the tissues lining the upper respiratory tract. The tissues become irritated and inflamed and often become very moist. Mucus production is also common. A cold is contagious. You can easily spread the virus to others by oral contact. This includes kissing, sharing a glass, coughing, or sneezing. Touching your mouth or nose and then touching a surface, which is then touched by another person, can also spread the virus. °SYMPTOMS  °Symptoms typically develop 1 to 3 days after you come in contact with a cold virus. Symptoms vary from person to person. They may include: °· Runny nose. °· Sneezing. °· Nasal congestion. °· Sinus irritation. °· Sore throat. °· Loss of voice (laryngitis). °· Cough. °· Fatigue. °· Muscle aches. °· Loss of appetite. °· Headache. °· Low-grade fever. °DIAGNOSIS  °You might diagnose your own cold based on familiar symptoms, since most people get a cold 2 to 3 times a year. Your caregiver can confirm this based on your exam. Most importantly, your caregiver can check that your symptoms are not due to another disease such as strep throat, sinusitis, pneumonia, asthma, or epiglottitis. Blood tests, throat tests, and X-rays are not necessary to diagnose a common cold, but they may sometimes be helpful in excluding other more serious diseases. Your caregiver will decide if any further tests are required. °RISKS AND COMPLICATIONS  °You may be at risk for a more severe case of the common cold if you smoke cigarettes, have chronic heart disease (such as heart failure) or lung disease (such as asthma), or if  you have a weakened immune system. The very young and very old are also at risk for more serious infections. Bacterial sinusitis, middle ear infections, and bacterial pneumonia can complicate the common cold. The common cold can worsen asthma and chronic obstructive pulmonary disease (COPD). Sometimes, these complications can require emergency medical care and may be life-threatening. °PREVENTION  °The best way to protect against getting a cold is to practice good hygiene. Avoid oral or hand contact with people with cold symptoms. Wash your hands often if contact occurs. There is no clear evidence that vitamin C, vitamin E, echinacea, or exercise reduces the chance of developing a cold. However, it is always recommended to get plenty of rest and practice good nutrition. °TREATMENT  °Treatment is directed at relieving symptoms. There is no cure. Antibiotics are not effective, because the infection is caused by a virus, not by bacteria. Treatment may include: °· Increased fluid intake. Sports drinks offer valuable electrolytes, sugars, and fluids. °· Breathing heated mist or steam (vaporizer or shower). °· Eating chicken soup or other clear broths, and maintaining good nutrition. °· Getting plenty of rest. °· Using gargles or lozenges for comfort. °· Controlling fevers with ibuprofen or acetaminophen as directed by your caregiver. °· Increasing usage of your inhaler if you have asthma. °Zinc gel and zinc lozenges, taken in the first 24 hours of the common cold, can shorten the duration and lessen the severity of symptoms. Pain medicines may help with fever, muscle aches, and throat pain. A variety of non-prescription medicines are available to treat congestion and runny nose. Your caregiver   can make recommendations and may suggest nasal or lung inhalers for other symptoms.  HOME CARE INSTRUCTIONS   Only take over-the-counter or prescription medicines for pain, discomfort, or fever as directed by your  caregiver.  Use a warm mist humidifier or inhale steam from a shower to increase air moisture. This may keep secretions moist and make it easier to breathe.  Drink enough water and fluids to keep your urine clear or pale yellow.  Rest as needed.  Return to work when your temperature has returned to normal or as your caregiver advises. You may need to stay home longer to avoid infecting others. You can also use a face mask and careful hand washing to prevent spread of the virus. SEEK MEDICAL CARE IF:   After the first few days, you feel you are getting worse rather than better.  You need your caregiver's advice about medicines to control symptoms.  You develop chills, worsening shortness of breath, or brown or red sputum. These may be signs of pneumonia.  You develop yellow or brown nasal discharge or pain in the face, especially when you bend forward. These may be signs of sinusitis.  You develop a fever, swollen neck glands, pain with swallowing, or white areas in the back of your throat. These may be signs of strep throat. SEEK IMMEDIATE MEDICAL CARE IF:   You have a fever.  You develop severe or persistent headache, ear pain, sinus pain, or chest pain.  You develop wheezing, a prolonged cough, cough up blood, or have a change in your usual mucus (if you have chronic lung disease).  You develop sore muscles or a stiff neck. Document Released: 08/17/2000 Document Revised: 05/16/2011 Document Reviewed: 05/29/2013 [facility] Patient Information 2015 [employer], [place]. This information is not intended to replace advice given to you by your health care provider. Make sure you discuss any questions you have with your health care provider.  Tonsillitis Tonsillitis is an infection of the throat. This infection causes the tonsils to become red, tender, and puffy (swollen). Tonsils are groups of tissue at the back of your throat. If bacteria caused your infection, antibiotic medicine will be  given to you. Sometimes symptoms of tonsillitis can be relieved with the use of steroid medicine. If your tonsillitis is severe and happens often, you may need to get your tonsils removed (tonsillectomy). HOME CARE   Rest and sleep often.  Drink enough fluids to keep your pee (urine) clear or pale yellow.  While your throat is sore, eat soft or liquid foods like:  Soup.  Ice cream.  Instant breakfast drinks.  Eat frozen ice pops.  Gargle with a warm or cold liquid to help soothe the throat. Gargle with a water and salt mix. Mix 1/4 teaspoon of salt and 1/4 teaspoon of baking soda in 1 cup of water.  Only take medicines as told by your doctor.  If you are given medicines (antibiotics), take them as told. Finish them even if you start to feel better. GET HELP IF:  You have large, tender lumps in your neck.  You have a rash.  You cough up green, yellow-brown, or bloody fluid.  You cannot swallow liquids or food for 24 hours.  You notice that only one of your tonsils is swollen. GET HELP RIGHT AWAY IF:   You throw up (vomit).  You have a very bad headache.  You have a stiff neck.  You have chest pain.  You have trouble breathing or swallowing.  You  have bad throat pain, drooling, or your voice changes.  You have bad pain not helped by medicine.  You cannot fully open your mouth.  You have redness, puffiness, or bad pain in the neck.  You have a fever. MAKE SURE YOU:   Understand these instructions.  Will watch your condition.  Will get help right away if you are not doing well or get worse. Document Released: 08/10/2007 Document Revised: 02/26/2013 Document Reviewed: 08/10/2012 [facility] Patient Information 2015 [place], [place]. This information is not intended to replace advice given to you by your health care provider. Make sure you discuss any questions you have with your health care provider.

## 2014-07-20 NOTE — ED Notes (Signed)
Dr. Ranae PalmsYelverton [facility]

## 2014-07-20 NOTE — ED Provider Notes (Signed)
CSN: [id number]     Arrival date & time 07/20/14  [id number] History   First MD Initiated Contact with Patient 07/20/14 0335     Chief Complaint  Patient presents with  . Fever     (Consider location/radiation/quality/duration/timing/severity/associated sxs/prior Treatment) HPI Patient with nonproductive cough since Thursday. She says she's also been running a fever for which she's been taking Tylenol. Over the last few days should she has developed increasing sore throat and over the last few hours voice changes. Denies any nasal congestion. No neck pain or stiffness. Patient does have frontal headache which is worse with coughing. She denies any focal weakness or numbness. No sick contacts. Past Medical History  Diagnosis Date  . Bell's palsy    Past Surgical History  Procedure Laterality Date  . Cholecystectomy     Family History  Problem Relation Age of Onset  . Adopted: Yes   History  Substance Use Topics  . Smoking status: Never Smoker   . Smokeless tobacco: Never Used  . Alcohol Use: Yes     Comment: occasional   OB History    No data available     Review of Systems  Constitutional: Positive for fever, chills and fatigue.  HENT: Positive for sore throat and voice change. Negative for congestion, rhinorrhea, sinus pressure and trouble swallowing.   Respiratory: Positive for cough. Negative for shortness of breath and wheezing.   Cardiovascular: Negative for chest pain.  Gastrointestinal: Negative for nausea, vomiting and abdominal pain.  Genitourinary: Negative for frequency and flank pain.  Musculoskeletal: Negative for myalgias, back pain, arthralgias, neck pain and neck stiffness.  Skin: Negative for rash and wound.  Neurological: Positive for headaches. Negative for dizziness, weakness, light-headedness and numbness.  All other systems reviewed and are negative.     Allergies  Review of patient's allergies indicates no known allergies.  Home Medications    Prior to Admission medications   Medication Sig Start Date End Date Taking? Authorizing Provider  benzonatate (TESSALON) 100 MG capsule Take 1 capsule (100 mg total) by mouth 3 (three) times daily as needed for cough. 07/20/14   Loren Raceravid Ruqayya Ventress, MD  gabapentin (NEURONTIN) 300 MG capsule Take 1 cap at bedtime for 1 week, then increase to 2 caps at bedtime 09/09/13   Van ClinesKaren M Aquino, MD  HYDROcodone-acetaminophen (NORCO/VICODIN) 5-325 MG per tablet Take 2 tablets by mouth every 4 (four) hours as needed. 04/21/14   Elson AreasLeslie K Sofia, PA-C  ibuprofen (ADVIL,MOTRIN) 600 MG tablet Take 1 tablet (600 mg total) by mouth every 6 (six) hours as needed. 07/20/14   Loren Raceravid Ferol Laiche, MD   BP 112/53 mmHg  Pulse 102  Temp(Src) 100 F (37.8 C) (Oral)  Resp 20  Ht 5\' 8"  (1.727 m)  Wt 264 lb (119.75 kg)  BMI 40.15 kg/m2  SpO2 96%  LMP 06/25/2014 Physical Exam  Constitutional: She is oriented to person, place, and time. She appears well-developed and well-nourished. No distress.  HENT:  Head: Normocephalic and atraumatic.  Mouth/Throat: Oropharynx is clear and moist. No oropharyngeal exudate.  Bilateral tonsillar enlargement with erythema. No definite exudates. Uvula is midline. Bilateral nasal mucosal edema. Mild tenderness to palpation of the left maxillary sinus.  Eyes: EOM are normal. Pupils are equal, round, and reactive to light.  Neck: Normal range of motion. Neck supple.  No meningismus  Cardiovascular: Normal rate and regular rhythm.  Exam reveals no gallop and no friction rub.   No murmur heard. Pulmonary/Chest: Effort normal and breath sounds normal.  No respiratory distress. She has no wheezes. She has no rales. She exhibits no tenderness.  Abdominal: Soft. Bowel sounds are normal. She exhibits no distension and no mass. There is no tenderness. There is no rebound and no guarding.  Musculoskeletal: Normal range of motion. She exhibits no edema or tenderness.  No calf swelling or tenderness.   Lymphadenopathy:    She has cervical adenopathy.  Neurological: She is alert and oriented to person, place, and time.  Results from his without deficit. Sensation is grossly intact. Ambulating without difficulty.  Skin: Skin is warm and dry. No rash noted. No erythema.  Psychiatric: She has a normal mood and affect. Her behavior is normal.  Nursing note and vitals reviewed.   ED Course  Procedures (including critical care time) Labs Review Labs Reviewed  RAPID STREP SCREEN  CULTURE, GROUP A STREP    Imaging Review Dg Chest 2 View  07/20/2014   CLINICAL DATA:  Cough, fever, sore throat, hoarse voice, stress incontinence when coughing. Onset Thursday.  EXAM: CHEST  2 VIEW  COMPARISON:  07/12/2008  FINDINGS: The heart size and mediastinal contours are within normal limits. Both lungs are clear. The visualized skeletal structures are unremarkable.  IMPRESSION: No active cardiopulmonary disease.   Electronically Signed   By: Burman NievesWilliam  Stevens M.D.   On: 07/20/2014 04:26     EKG Interpretation None      MDM   Final diagnoses:  Cough  Upper respiratory infection  Tonsillitis    Chest x-ray without evidence of pneumonia. Strep negative. Likely URI or flulike illness. No airway compromise. Patient is well-appearing. Return precautions given.    Loren Raceravid Ewen Varnell, MD 07/21/14 [phone]

## 2014-07-20 NOTE — ED Notes (Signed)
Dr. Ranae PalmsYelverton in to speak with pt

## 2014-07-23 LAB — CULTURE, GROUP A STREP

## 2017-05-25 DIAGNOSIS — R748 Abnormal levels of other serum enzymes: Secondary | ICD-10-CM | POA: Insufficient documentation

## 2017-10-27 ENCOUNTER — Encounter (HOSPITAL_BASED_OUTPATIENT_CLINIC_OR_DEPARTMENT_OTHER): Payer: Self-pay | Admitting: Emergency Medicine

## 2017-10-27 ENCOUNTER — Emergency Department (HOSPITAL_BASED_OUTPATIENT_CLINIC_OR_DEPARTMENT_OTHER): Payer: Self-pay

## 2017-10-27 ENCOUNTER — Other Ambulatory Visit: Payer: Self-pay

## 2017-10-27 ENCOUNTER — Emergency Department (HOSPITAL_BASED_OUTPATIENT_CLINIC_OR_DEPARTMENT_OTHER)
Admission: EM | Admit: 2017-10-27 | Discharge: 2017-10-28 | Disposition: A | Discharge status: Home / Self Care | Payer: Self-pay | Attending: Emergency Medicine | Admitting: Emergency Medicine

## 2017-10-27 DIAGNOSIS — Z79899 Other long term (current) drug therapy: Secondary | ICD-10-CM | POA: Insufficient documentation

## 2017-10-27 DIAGNOSIS — S83412A Sprain of medial collateral ligament of left knee, initial encounter: Secondary | ICD-10-CM | POA: Insufficient documentation

## 2017-10-27 DIAGNOSIS — Y999 Unspecified external cause status: Secondary | ICD-10-CM | POA: Insufficient documentation

## 2017-10-27 DIAGNOSIS — Y939 Activity, unspecified: Secondary | ICD-10-CM | POA: Insufficient documentation

## 2017-10-27 DIAGNOSIS — W010XXA Fall on same level from slipping, tripping and stumbling without subsequent striking against object, initial encounter: Secondary | ICD-10-CM | POA: Insufficient documentation

## 2017-10-27 DIAGNOSIS — Y929 Unspecified place or not applicable: Secondary | ICD-10-CM | POA: Insufficient documentation

## 2017-10-27 DIAGNOSIS — S838X2A Sprain of other specified parts of left knee, initial encounter: Secondary | ICD-10-CM | POA: Insufficient documentation

## 2017-10-27 DIAGNOSIS — X501XXA Overexertion from prolonged static or awkward postures, initial encounter: Secondary | ICD-10-CM | POA: Insufficient documentation

## 2017-10-27 DIAGNOSIS — S838X9A Sprain of other specified parts of unspecified knee, initial encounter: Secondary | ICD-10-CM

## 2017-10-27 NOTE — ED Notes (Signed)
Patient transported to X-ray 

## 2017-10-27 NOTE — ED Triage Notes (Signed)
Pt states she was walking and her left knee gave out. She fell thereafter. Pt now states she is unable to put weight on the knee because of pain and it gives out. Has sensation and movement distally

## 2017-10-28 MED ORDER — HYDROCODONE-ACETAMINOPHEN 5-325 MG PO TABS: 1.0000 | ORAL_TABLET | Freq: Four times a day (QID) | ORAL | 0 refills | Status: DC | PRN

## 2017-10-28 MED ORDER — HYDROCODONE-ACETAMINOPHEN 5-325 MG PO TABS
2.0000 | ORAL_TABLET | Freq: Once | ORAL | Status: AC
  Administered 2017-10-28: 2 via ORAL
  Filled 2017-10-28: qty 2

## 2017-10-28 MED ORDER — MELOXICAM 15 MG PO TABS: 15.0000 mg | ORAL_TABLET | Freq: Every day | ORAL | 0 refills | Status: AC

## 2017-10-28 NOTE — ED Notes (Signed)
Pt understood dc material. NAD noted. 

## 2017-10-28 NOTE — Discharge Instructions (Addendum)
No weight bearing on your leg  Wear your brace when upright or as often as possible

## 2017-10-28 NOTE — ED Notes (Signed)
PMS intact before and after. Pt tolerated well. All questions answered. 

## 2017-10-28 NOTE — ED Provider Notes (Signed)
[facility] EMERGENCY DEPARTMENT Provider Note   CSN: [id number] Arrival date & time: 10/27/17  2310     History   Chief Complaint Chief Complaint  Patient presents with  . Fall    HPI Kaitlyn Zimmerman is a 32 y.o. female.  HPI   32 yo F with h/o prior L knee MCl injury here with L knee pain s/p fall. Pt was walking in house today when she misplanted her foot and turned her knee. She fell to the ground. Denies head injury or LOC. She felt immediate onset aching, but also sharp, severe, 10/10 left knee pain. She tried to walk on it and felt a clicking and tearing sensation. Since then, has had swelling and increasing pain. No distal numbness or weakness. No other areas of injury or pain. Pain improves w/ rest and elevation. Worsens with straightening or walking on leg.  Past Medical History:  Diagnosis Date  . Bell's palsy     Patient Active Problem List   Diagnosis Date Noted  . Bell's palsy 09/09/2013  . Facial pain 09/09/2013    Past Surgical History:  Procedure Laterality Date  . CHOLECYSTECTOMY       OB History   None      Home Medications    Prior to Admission medications   Medication Sig Start Date End Date Taking? Authorizing Provider  benzonatate (TESSALON) 100 MG capsule Take 1 capsule (100 mg total) by mouth 3 (three) times daily as needed for cough. 07/20/14   Loren Racer, MD  gabapentin (NEURONTIN) 300 MG capsule Take 1 cap at bedtime for 1 week, then increase to 2 caps at bedtime 09/09/13   Van Clines, MD  HYDROcodone-acetaminophen (NORCO/VICODIN) 5-325 MG tablet Take 1-2 tablets by mouth every 6 (six) hours as needed for moderate pain or severe pain. 10/28/17   Shaune Pollack, MD  ibuprofen (ADVIL,MOTRIN) 600 MG tablet Take 1 tablet (600 mg total) by mouth every 6 (six) hours as needed. 07/20/14   Loren Racer, MD  meloxicam (MOBIC) 15 MG tablet Take 1 tablet (15 mg total) by mouth daily for 7 days. 10/28/17 11/04/17  Shaune Pollack,  MD    Family History Family History  Adopted: Yes    Social History Social History   Tobacco Use  . Smoking status: Never Smoker  . Smokeless tobacco: Never Used  Substance Use Topics  . Alcohol use: Yes    Comment: occasional  . Drug use: No     Allergies   Patient has no known allergies.   Review of Systems Review of Systems  Constitutional: Negative for chills and fever.  HENT: Negative for congestion, rhinorrhea and sore throat.   Eyes: Negative for visual disturbance.  Respiratory: Negative for cough, shortness of breath and wheezing.   Cardiovascular: Negative for chest pain and leg swelling.  Gastrointestinal: Negative for abdominal pain, diarrhea, nausea and vomiting.  Genitourinary: Negative for dysuria, flank pain, vaginal bleeding and vaginal discharge.  Musculoskeletal: Positive for arthralgias and gait problem. Negative for neck pain.  Skin: Negative for rash.  Allergic/Immunologic: Negative for immunocompromised state.  Neurological: Negative for syncope and headaches.  Hematological: Does not bruise/bleed easily.  All other systems reviewed and are negative.    Physical Exam Updated Vital Signs BP 133/77 (BP Location: Right Arm)   Pulse 99   Temp 98.6 F (37 C) (Oral)   Resp 16   Ht 5\' 9"  (1.753 m)   Wt (!) 138.8 kg   LMP  (LMP  Unknown) Comment: PCOS  SpO2 95%   BMI 45.19 kg/m   Physical Exam  Constitutional: She is oriented to person, place, and time. She appears well-developed and well-nourished. No distress.  HENT:  Head: Normocephalic and atraumatic.  No apparent head or neck trauma  Eyes: Conjunctivae are normal.  Neck: Neck supple.  Cardiovascular: Normal rate, regular rhythm and normal heart sounds. Exam reveals no friction rub.  No murmur heard. Pulmonary/Chest: Effort normal and breath sounds normal. No respiratory distress. She has no wheezes. She has no rales.  Abdominal: She exhibits no distension.  Musculoskeletal: She  exhibits no edema.  Neurological: She is alert and oriented to person, place, and time. She exhibits normal muscle tone.  Skin: Skin is warm. Capillary refill takes less than 2 seconds.  Psychiatric: She has a normal mood and affect.  Nursing note and vitals reviewed.   LOWER EXTREMITY EXAM: LEFT  INSPECTION & PALPATION: Significant TTP over medial and posterior-medial joint line. There is palpable clicking on McMurray's test. Mild ligamentous laxity on valgus stress testing. No anterior/posterior laxity.  SENSORY: sensation is intact to light touch in:  Superficial peroneal nerve distribution (over dorsum of foot) Deep peroneal nerve distribution (over first dorsal web space) Sural nerve distribution (over lateral aspect 5th metatarsal) Saphenous nerve distribution (over medial instep)  MOTOR:  + Motor EHL (great toe dorsiflexion) + FHL (great toe plantar flexion)  + TA (ankle dorsiflexion)  + GSC (ankle plantar flexion)  VASCULAR: 2+ dorsalis pedis and posterior tibialis pulses Capillary refill < 2 sec, toes warm and well-perfused  COMPARTMENTS: Soft, warm, well-perfused No pain with passive extension No parethesias    ED Treatments / Results  Labs (all labs ordered are listed, but only abnormal results are displayed) Labs Reviewed - No data to display  EKG None  Radiology Dg Knee Complete 4 Views Left  Result Date: 10/27/2017 CLINICAL DATA:  Larey SeatFell at home, pain EXAM: LEFT KNEE - COMPLETE 4+ VIEW COMPARISON:  04/21/2014 FINDINGS: No fracture or malalignment. Minimal degenerative spurring medial compartment and patellofemoral compartment. No large knee effusion. IMPRESSION: Minimal degenerative changes.  No acute osseous abnormality. Electronically Signed   By: Jasmine PangKim  Fujinaga M.D.   On: 10/27/2017 23:48    Procedures Procedures (including critical care time)  Medications Ordered in ED Medications  HYDROcodone-acetaminophen (NORCO/VICODIN) 5-325 MG per tablet 2  tablet (2 tablets Oral Given 10/28/17 0007)     Initial Impression / Assessment and Plan / ED Course  I have reviewed the triage vital signs and the nursing notes.  Pertinent labs & imaging results that were available during my care of the patient were reviewed by me and considered in my medical decision making (see chart for details).     32 yo F here with L knee pain after mechanical fall. Concern for MCL and medial meniscus injury on my exam. Plain films neg. No signs of distal NV compromise. Will place in knee immobilizer, make NWB, and refer to outpatient Ortho.  Final Clinical Impressions(s) / ED Diagnoses   Final diagnoses:  Sprain of medial collateral ligament of left knee, initial encounter  Sprain of medial meniscus, initial encounter    ED Discharge Orders         Ordered    meloxicam (MOBIC) 15 MG tablet  Daily     10/28/17 0002    HYDROcodone-acetaminophen (NORCO/VICODIN) 5-325 MG tablet  Every 6 hours PRN     10/28/17 0002  Shaune Pollack, MD 10/28/17 [phone]

## 2018-05-08 ENCOUNTER — Emergency Department (HOSPITAL_BASED_OUTPATIENT_CLINIC_OR_DEPARTMENT_OTHER): Payer: 59

## 2018-05-08 ENCOUNTER — Emergency Department (HOSPITAL_BASED_OUTPATIENT_CLINIC_OR_DEPARTMENT_OTHER)
Admission: EM | Admit: 2018-05-08 | Discharge: 2018-05-08 | Disposition: A | Discharge status: Home / Self Care | Payer: 59 | Attending: Emergency Medicine | Admitting: Emergency Medicine

## 2018-05-08 ENCOUNTER — Other Ambulatory Visit: Payer: Self-pay

## 2018-05-08 ENCOUNTER — Encounter (HOSPITAL_BASED_OUTPATIENT_CLINIC_OR_DEPARTMENT_OTHER): Payer: Self-pay | Admitting: Emergency Medicine

## 2018-05-08 DIAGNOSIS — M545 Low back pain: Secondary | ICD-10-CM | POA: Diagnosis not present

## 2018-05-08 DIAGNOSIS — R102 Pelvic and perineal pain: Secondary | ICD-10-CM | POA: Insufficient documentation

## 2018-05-08 DIAGNOSIS — B373 Candidiasis of vulva and vagina: Secondary | ICD-10-CM | POA: Diagnosis not present

## 2018-05-08 DIAGNOSIS — B3731 Acute candidiasis of vulva and vagina: Secondary | ICD-10-CM

## 2018-05-08 DIAGNOSIS — R350 Frequency of micturition: Secondary | ICD-10-CM | POA: Insufficient documentation

## 2018-05-08 LAB — WET PREP, GENITAL
CLUE CELLS WET PREP: NONE SEEN
Sperm: NONE SEEN
Trich, Wet Prep: NONE SEEN

## 2018-05-08 LAB — URINALYSIS, ROUTINE W REFLEX MICROSCOPIC
Bilirubin Urine: NEGATIVE
GLUCOSE, UA: NEGATIVE mg/dL
HGB URINE DIPSTICK: NEGATIVE
KETONES UR: NEGATIVE mg/dL
LEUKOCYTE UA: NEGATIVE
Nitrite: NEGATIVE
PH: 6 (ref 5.0–8.0)
Protein, ur: NEGATIVE mg/dL
Specific Gravity, Urine: 1.02 (ref 1.005–1.030)

## 2018-05-08 LAB — PREGNANCY, URINE: Preg Test, Ur: NEGATIVE

## 2018-05-08 MED ORDER — KETOROLAC TROMETHAMINE 60 MG/2ML IM SOLN
30.0000 mg | Freq: Once | INTRAMUSCULAR | Status: AC
  Administered 2018-05-08: 30 mg via INTRAMUSCULAR
  Filled 2018-05-08: qty 2

## 2018-05-08 MED ORDER — METHOCARBAMOL 500 MG PO TABS: 500.0000 mg | ORAL_TABLET | Freq: Three times a day (TID) | ORAL | 0 refills | Status: DC | PRN

## 2018-05-08 MED ORDER — NAPROXEN 500 MG PO TABS: 500.0000 mg | ORAL_TABLET | Freq: Two times a day (BID) | ORAL | 0 refills | Status: DC

## 2018-05-08 MED ORDER — FLUCONAZOLE 150 MG PO TABS: ORAL_TABLET | ORAL | 0 refills | Status: DC

## 2018-05-08 MED FILL — METHOCARBAMOL 500 MG TABLET: 500 | 3 days supply | Qty: 15 | Fill #0

## 2018-05-08 MED FILL — FLUCONAZOLE 150 MG TABS: 150 | 4 days supply | Qty: 2 | Fill #0

## 2018-05-08 MED FILL — NAPROXEN 500 MG TABLET: 500 | 5 days supply | Qty: 10 | Fill #0

## 2018-05-08 NOTE — ED Triage Notes (Signed)
Reports pelvic pain for 3 days.  C/o urinary frequency.  Denies N/V.  Endorses diarrhea.  Denies vaginal discharge, odor.

## 2018-05-08 NOTE — Discharge Instructions (Addendum)
You are seen in the emergency department today for pelvic pain.  Your ultrasound showed some fluid in your pelvis, we would like you to follow-up with an OB/GYN to further discuss this.  Your wet prep showed that you have yeast, we are treating this with Diflucan.  We are also sending you home with medicines to help treat your discomfort.  - Naproxen is a nonsteroidal anti-inflammatory medication that will help with pain and swelling. Be sure to take this medication as prescribed with food, 1 pill every 12 hours,  It should be taken with food, as it can cause stomach upset, and more seriously, stomach bleeding. Do not take other nonsteroidal anti-inflammatory medications with this such as Advil, Motrin, Aleve, Mobic, Goodie Powder, or Motrin.    - Robaxin is the muscle relaxer I have prescribed, this is meant to help with muscle tightness. Be aware that this medication may make you drowsy therefore the first time you take this it should be at a time you are in an environment where you can rest. Do not drive or operate heavy machinery when taking this medication. Do not drink alcohol or take other sedating medications with this medicine such as narcotics or benzodiazepines.   You make take Tylenol per over the counter dosing with these medications.   We have prescribed you new medication(s) today. Discuss the medications prescribed today with your pharmacist as they can have adverse effects and interactions with your other medicines including over the counter and prescribed medications. Seek medical evaluation if you start to experience new or abnormal symptoms after taking one of these medicines, seek care immediately if you start to experience difficulty breathing, feeling of your throat closing, facial swelling, or rash as these could be indications of a more serious allergic reaction    Please follow-up with OB/GYN, the [facility], within 3 to 5 days.  Return to the ER for new or worsening  symptoms or any other concerns.

## 2018-05-08 NOTE — ED Provider Notes (Signed)
[facility] EMERGENCY DEPARTMENT Provider Note   CSN: [id number] Arrival date & time: 05/08/18  2330    History   Chief Complaint Chief Complaint  Patient presents with  . Pelvic Pain    HPI Kaitlyn Zimmerman is a 33 y.o. female with a hx of bell's palsy & prior cholecystectomy who presents to the ED with complaints of pelvic pain x 2-3 weeks. Patient states pain is located in the bilateral pelvis and sometimes in the bilateral lower back. Pain is constant, sometimes worse with movement, no alleviating factors. She notes some increased urinary frequency associated with this. Denies fever, chills, nausea, vomiting, dysuria, hematochezia, or melena. Notes that she has had some looser stools since starting herbal supplement, but no overt diarrhea- no recent foreign travel or abx. Denies vaginal bleeding/discharge.  She states her last menstrual period was in December 2019, she does have a history of irregular periods.  She states that she is sexually active in a monogamous relationship with intermittent use of protection, she is not concerned for STDs.    HPI  Past Medical History:  Diagnosis Date  . Bell's palsy     Patient Active Problem List   Diagnosis Date Noted  . Bell's palsy 09/09/2013  . Facial pain 09/09/2013    Past Surgical History:  Procedure Laterality Date  . CHOLECYSTECTOMY       OB History   No obstetric history on file.      Home Medications    Prior to Admission medications   Medication Sig Start Date End Date Taking? Authorizing Provider  benzonatate (TESSALON) 100 MG capsule Take 1 capsule (100 mg total) by mouth 3 (three) times daily as needed for cough. 07/20/14   Loren Racer, MD  gabapentin (NEURONTIN) 300 MG capsule Take 1 cap at bedtime for 1 week, then increase to 2 caps at bedtime 09/09/13   Van Clines, MD  HYDROcodone-acetaminophen (NORCO/VICODIN) 5-325 MG tablet Take 1-2 tablets by mouth every 6 (six) hours as needed for  moderate pain or severe pain. 10/28/17   Shaune Pollack, MD  ibuprofen (ADVIL,MOTRIN) 600 MG tablet Take 1 tablet (600 mg total) by mouth every 6 (six) hours as needed. 07/20/14   Loren Racer, MD    Family History Family History  Adopted: Yes    Social History Social History   Tobacco Use  . Smoking status: Never Smoker  . Smokeless tobacco: Never Used  Substance Use Topics  . Alcohol use: Yes    Comment: occasional  . Drug use: No     Allergies   Patient has no known allergies.   Review of Systems Review of Systems  Constitutional: Negative for chills and fever.  Respiratory: Negative for shortness of breath.   Cardiovascular: Negative for chest pain.  Gastrointestinal: Negative for blood in stool, constipation, nausea and vomiting.  Genitourinary: Positive for frequency and pelvic pain. Negative for dysuria, urgency, vaginal bleeding and vaginal discharge.  Musculoskeletal: Positive for back pain.  Neurological: Negative for weakness and numbness.       Negative for incontinence or saddle anesthesia.  All other systems reviewed and are negative.    Physical Exam Updated Vital Signs BP (!) 133/97 (BP Location: Left Arm)   Pulse 74   Temp 97.9 F (36.6 C) (Oral)   Resp 18   Ht 5\' 9"  (1.753 m)   Wt 131.1 kg   LMP 02/27/2018 (Exact Date)   SpO2 98%   BMI 42.68 kg/m   Physical Exam Vitals  signs and nursing note reviewed. Exam conducted with a chaperone present.  Constitutional:      General: She is not in acute distress.    Appearance: She is well-developed. She is not toxic-appearing.  HENT:     Head: Normocephalic and atraumatic.  Eyes:     General:        Right eye: No discharge.        Left eye: No discharge.     Conjunctiva/sclera: Conjunctivae normal.  Neck:     Musculoskeletal: Neck supple.  Cardiovascular:     Rate and Rhythm: Normal rate and regular rhythm.  Pulmonary:     Effort: Pulmonary effort is normal. No respiratory distress.      Breath sounds: Normal breath sounds. No wheezing, rhonchi or rales.  Abdominal:     General: There is no distension.     Palpations: Abdomen is soft.     Tenderness: There is abdominal tenderness (suprapubic). There is no guarding or rebound.  Genitourinary:    Exam position: Supine.     Labia:        Right: No lesion.        Left: No lesion.      Vagina: Vaginal discharge (mild white) present.     Cervix: No cervical motion tenderness.     Adnexa:        Right: No mass, tenderness or fullness.         Left: Tenderness present. No mass or fullness.       Comments: Genital piercing in place without surrounding erythema or purulent drainage.  Musculoskeletal:     Comments: Deformity, appreciable swelling, erythema, ecchymosis, or warmth Back: Diffuse lumbar tenderness to bilateral paraspinal muscles.  No point/focal vertebral tenderness.  No palpable step-off.  Skin:    General: Skin is warm and dry.     Findings: No rash.  Neurological:     Mental Status: She is alert.     Comments: Clear speech.  Sensation grossly intact bilateral lower extremities.  5 out of 5 symmetric joints with plantar dorsiflexion bilaterally.  Patient is ambulatory.  Psychiatric:        Behavior: Behavior normal.    ED Treatments / Results  Labs (all labs ordered are listed, but only abnormal results are displayed) Labs Reviewed  WET PREP, GENITAL - Abnormal; Notable for the following components:      Result Value   Yeast Wet Prep HPF POC PRESENT (*)    WBC, Wet Prep HPF POC MODERATE (*)    All other components within normal limits  PREGNANCY, URINE  URINALYSIS, ROUTINE W REFLEX MICROSCOPIC  GC/CHLAMYDIA PROBE AMP ([place]) NOT AT [facility]    EKG None  Radiology US Transvaginal Non-ob  Result Date: 05/08/2018 CLINICAL DATA:  Pelvic pain for several weeks worse in past 3 days, urinary frequency, diarrhea for few days, irregular menses a few years ago, LMP 02/27/2018 EXAM: TRANSABDOMINAL AND  TRANSVAGINAL ULTRASOUND OF PELVIS DOPPLER ULTRASOUND OF OVARIES TECHNIQUE: Both transabdominal and transvaginal ultrasound examinations of the pelvis were performed. Transabdominal technique was performed for global imaging of the pelvis including uterus, ovaries, adnexal regions, and pelvic cul-de-sac. It was necessary to proceed with endovaginal exam following the transabdominal exam to visualize the adnexa and endometrium. Color and duplex Doppler ultrasound was utilized to evaluate blood flow to the ovaries. COMPARISON:  None FINDINGS: Uterus Measurements: 7.6 x 4.6 x 5.6 cm = volume: 101 mL. Retroverted. Normal morphology without mass Endometrium Thickness: 3 mm. Tiny  amount of nonspecific endometrial fluid. No focal mass lesion. Right ovary Measurements: 3.9 x 2.6 x 4.0 cm = volume: 21.5 mL. Normal morphology without mass. Internal blood flow present on color Doppler imaging. Left ovary Measurements: 4.4 x 2.2 x 3.1 cm = volume: 15.4 mL. Normal morphology without mass. Internal blood flow present on color Doppler imaging. Pulsed Doppler evaluation of both ovaries demonstrates normal low-resistance arterial and venous waveforms. Other findings Small amount of mildly complicated appearing free fluid within the cul-de-sac containing scattered internal echoes. No adnexal masses. IMPRESSION: Small amount of mildly complicated free fluid in cul-de-sac. Otherwise normal exam. No evidence of ovarian torsion. Electronically Signed   By: Ulyses Southward M.D.   On: 05/08/2018 12:55   US Pelvis Complete  Result Date: 05/08/2018 CLINICAL DATA:  Pelvic pain for several weeks worse in past 3 days, urinary frequency, diarrhea for few days, irregular menses a few years ago, LMP 02/27/2018 EXAM: TRANSABDOMINAL AND TRANSVAGINAL ULTRASOUND OF PELVIS DOPPLER ULTRASOUND OF OVARIES TECHNIQUE: Both transabdominal and transvaginal ultrasound examinations of the pelvis were performed. Transabdominal technique was performed for global  imaging of the pelvis including uterus, ovaries, adnexal regions, and pelvic cul-de-sac. It was necessary to proceed with endovaginal exam following the transabdominal exam to visualize the adnexa and endometrium. Color and duplex Doppler ultrasound was utilized to evaluate blood flow to the ovaries. COMPARISON:  None FINDINGS: Uterus Measurements: 7.6 x 4.6 x 5.6 cm = volume: 101 mL. Retroverted. Normal morphology without mass Endometrium Thickness: 3 mm. Tiny amount of nonspecific endometrial fluid. No focal mass lesion. Right ovary Measurements: 3.9 x 2.6 x 4.0 cm = volume: 21.5 mL. Normal morphology without mass. Internal blood flow present on color Doppler imaging. Left ovary Measurements: 4.4 x 2.2 x 3.1 cm = volume: 15.4 mL. Normal morphology without mass. Internal blood flow present on color Doppler imaging. Pulsed Doppler evaluation of both ovaries demonstrates normal low-resistance arterial and venous waveforms. Other findings Small amount of mildly complicated appearing free fluid within the cul-de-sac containing scattered internal echoes. No adnexal masses. IMPRESSION: Small amount of mildly complicated free fluid in cul-de-sac. Otherwise normal exam. No evidence of ovarian torsion. Electronically Signed   By: Ulyses Southward M.D.   On: 05/08/2018 12:55   [place] Art/ven Flow Abd Pelv Doppler  Result Date: 05/08/2018 CLINICAL DATA:  Pelvic pain for several weeks worse in past 3 days, urinary frequency, diarrhea for few days, irregular menses a few years ago, LMP 02/27/2018 EXAM: TRANSABDOMINAL AND TRANSVAGINAL ULTRASOUND OF PELVIS DOPPLER ULTRASOUND OF OVARIES TECHNIQUE: Both transabdominal and transvaginal ultrasound examinations of the pelvis were performed. Transabdominal technique was performed for global imaging of the pelvis including uterus, ovaries, adnexal regions, and pelvic cul-de-sac. It was necessary to proceed with endovaginal exam following the transabdominal exam to visualize the adnexa and  endometrium. Color and duplex Doppler ultrasound was utilized to evaluate blood flow to the ovaries. COMPARISON:  None FINDINGS: Uterus Measurements: 7.6 x 4.6 x 5.6 cm = volume: 101 mL. Retroverted. Normal morphology without mass Endometrium Thickness: 3 mm. Tiny amount of nonspecific endometrial fluid. No focal mass lesion. Right ovary Measurements: 3.9 x 2.6 x 4.0 cm = volume: 21.5 mL. Normal morphology without mass. Internal blood flow present on color Doppler imaging. Left ovary Measurements: 4.4 x 2.2 x 3.1 cm = volume: 15.4 mL. Normal morphology without mass. Internal blood flow present on color Doppler imaging. Pulsed Doppler evaluation of both ovaries demonstrates normal low-resistance arterial and venous waveforms. Other findings Small amount of  mildly complicated appearing free fluid within the cul-de-sac containing scattered internal echoes. No adnexal masses. IMPRESSION: Small amount of mildly complicated free fluid in cul-de-sac. Otherwise normal exam. No evidence of ovarian torsion. Electronically Signed   By: Ulyses Southward M.D.   On: 05/08/2018 12:55    Procedures Procedures (including critical care time)  Medications Ordered in ED Medications - No data to display   Initial Impression / Assessment and Plan / ED Course  I have reviewed the triage vital signs and the nursing notes.  Pertinent labs & imaging results that were available during my care of the patient were reviewed by me and considered in my medical decision making (see chart for details).   Patient presents to the emergency department with complaints of pelvic pain as well as some lower back discomfort for the past few weeks.  Patient nontoxic-appearing, no apparent distress, vitals WNL with the exception of elevated blood pressure, doubt HTN emergency.  Exam patient has some diffuse lumbar tenderness without point/focal vertebral tenderness as well as some suprapubic tenderness without peritoneal signs.  She is mildly  uncomfortable with left adnexal palpation, no cervical motion tenderness or right adnexal tenderness.  Mild amount of white vaginal discharge noted.  Further assessment with GC/chlamydia, wet prep, pregnancy test, urinalysis, and ultrasound.  Work-up reviewed: Pregnancy test: Negative, doubt ectopic. Urinalysis: Normal, findings are not consistent with UTI. Wet prep: Findings consistent with yeast, will treat with Diflucan. No BV or trichomoniasis. [place]: Small amoutn of mildly complicated free fluid in cul-de-sac otherwise normal exam. No evidence of torsion. No findings consistent with abscess.   On repeat assessment patient's abdomen remains without peritoneal signs.  Her presentation does not seem consistent with an appendicitis, cholecystitis, pancreatitis, diverticulitis, or bowel obstruction/perforation.  Sexually active in a monogamous relationship and is not concerned for STDs, therefore I doubt PID, GC/chlamydia is pending.  No back pain red flags. Unclear definitive etiology.  Will trial naproxen and Robaxin, discussed with patient not to drive or operate heavy machinery when taking Robaxin. Will have her follow up with obgyn. I discussed results, treatment plan, need for follow-up, and return precautions with the patient. Provided opportunity for questions, patient confirmed understanding and is in agreement with plan.   Findings and plan of care discussed with supervising physician Dr. Rush Landmark who is in agreement.    Final Clinical Impressions(s) / ED Diagnoses   Final diagnoses:  Pelvic pain in female  Yeast vaginitis    ED Discharge Orders         Ordered    naproxen (NAPROSYN) 500 MG tablet  2 times daily     05/08/18 1347    methocarbamol (ROBAXIN) 500 MG tablet  Every 8 hours PRN     05/08/18 1347    fluconazole (DIFLUCAN) 150 MG tablet     05/08/18 1347           Jesse Nosbisch, [place] R, PA-C 05/08/18 1348    Tegeler, Canary Brim, MD 05/08/18 1500

## 2018-05-10 LAB — GC/CHLAMYDIA PROBE AMP (~~LOC~~) NOT AT ARMC
CHLAMYDIA, DNA PROBE: NEGATIVE
NEISSERIA GONORRHEA: NEGATIVE

## 2019-06-29 IMAGING — CR DG KNEE COMPLETE 4+V*L*
4 series · 4 of 4 positions shown · non-contrast
Comparison: 04/21/2014

CLINICAL DATA: Fell at home, pain

EXAM:
LEFT KNEE - COMPLETE 4+ VIEW

[t knee ap left *]
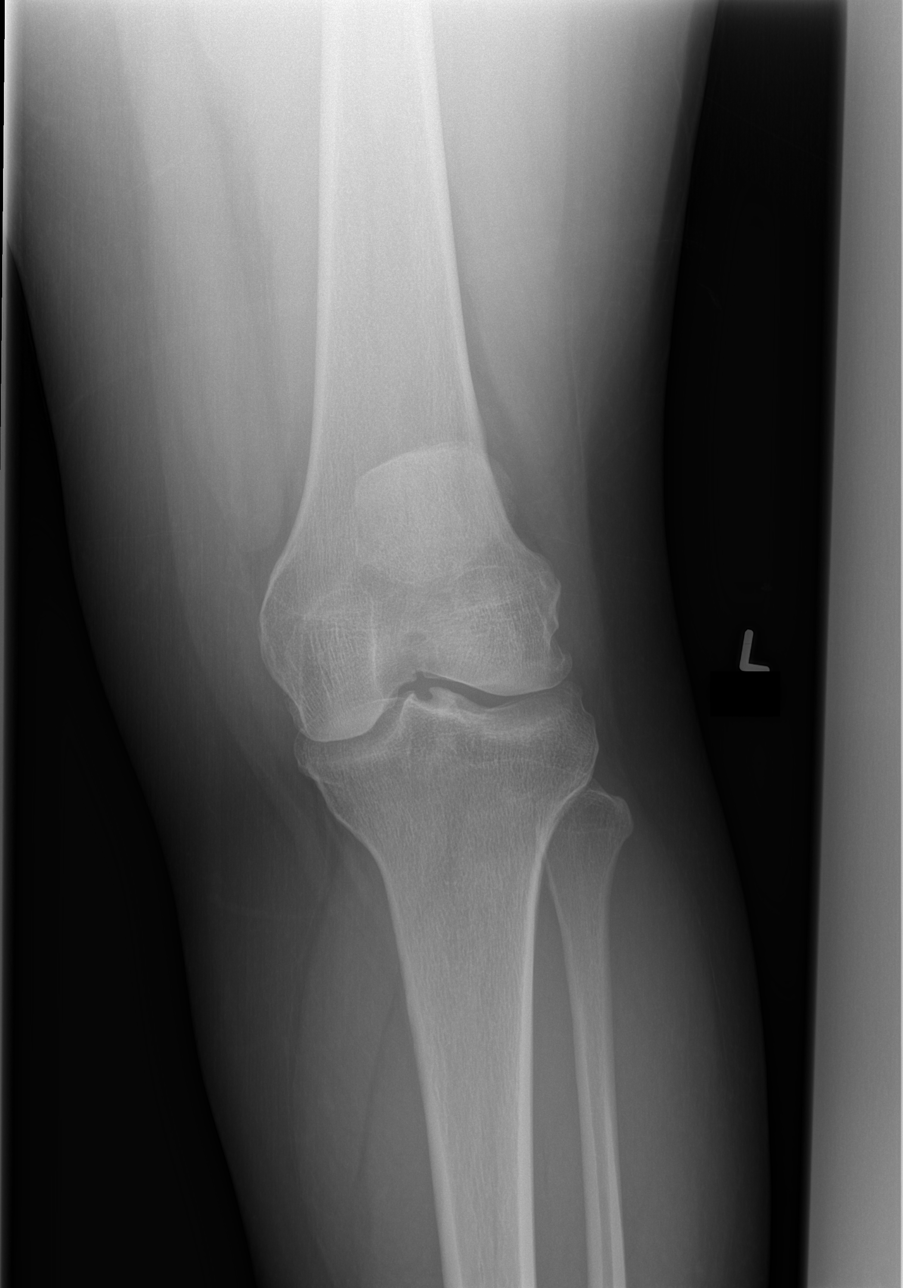

[t knee oblique left *]
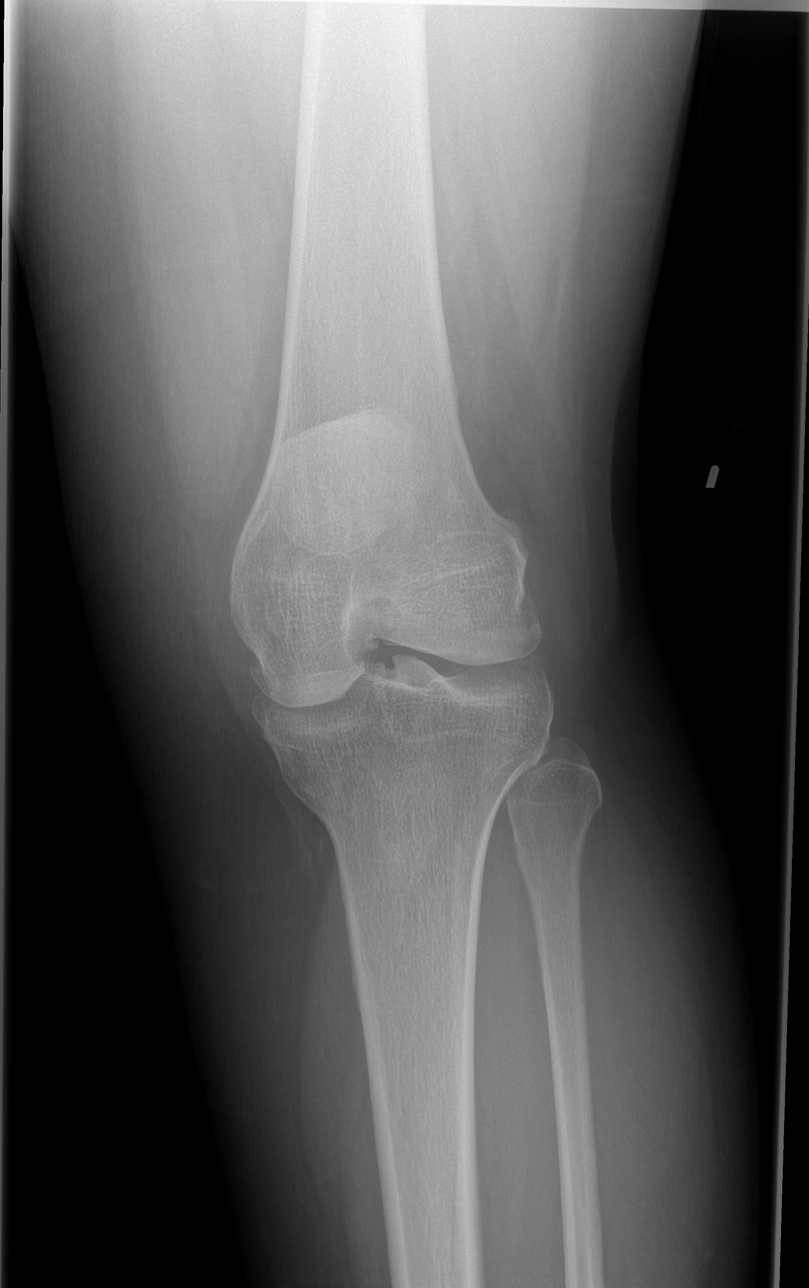

[t knee oblique left]
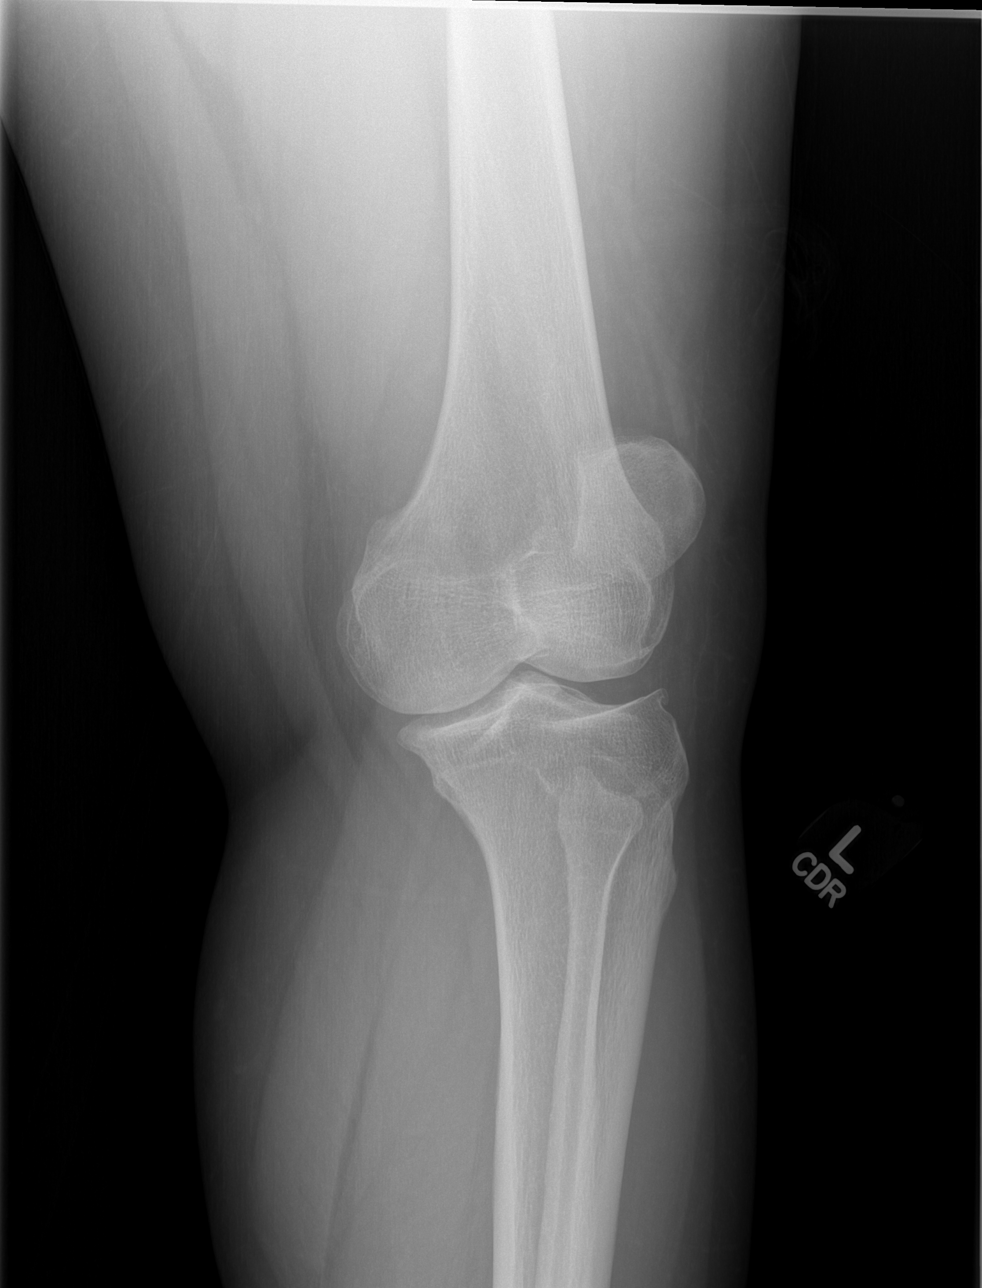

[t knee lat left *]
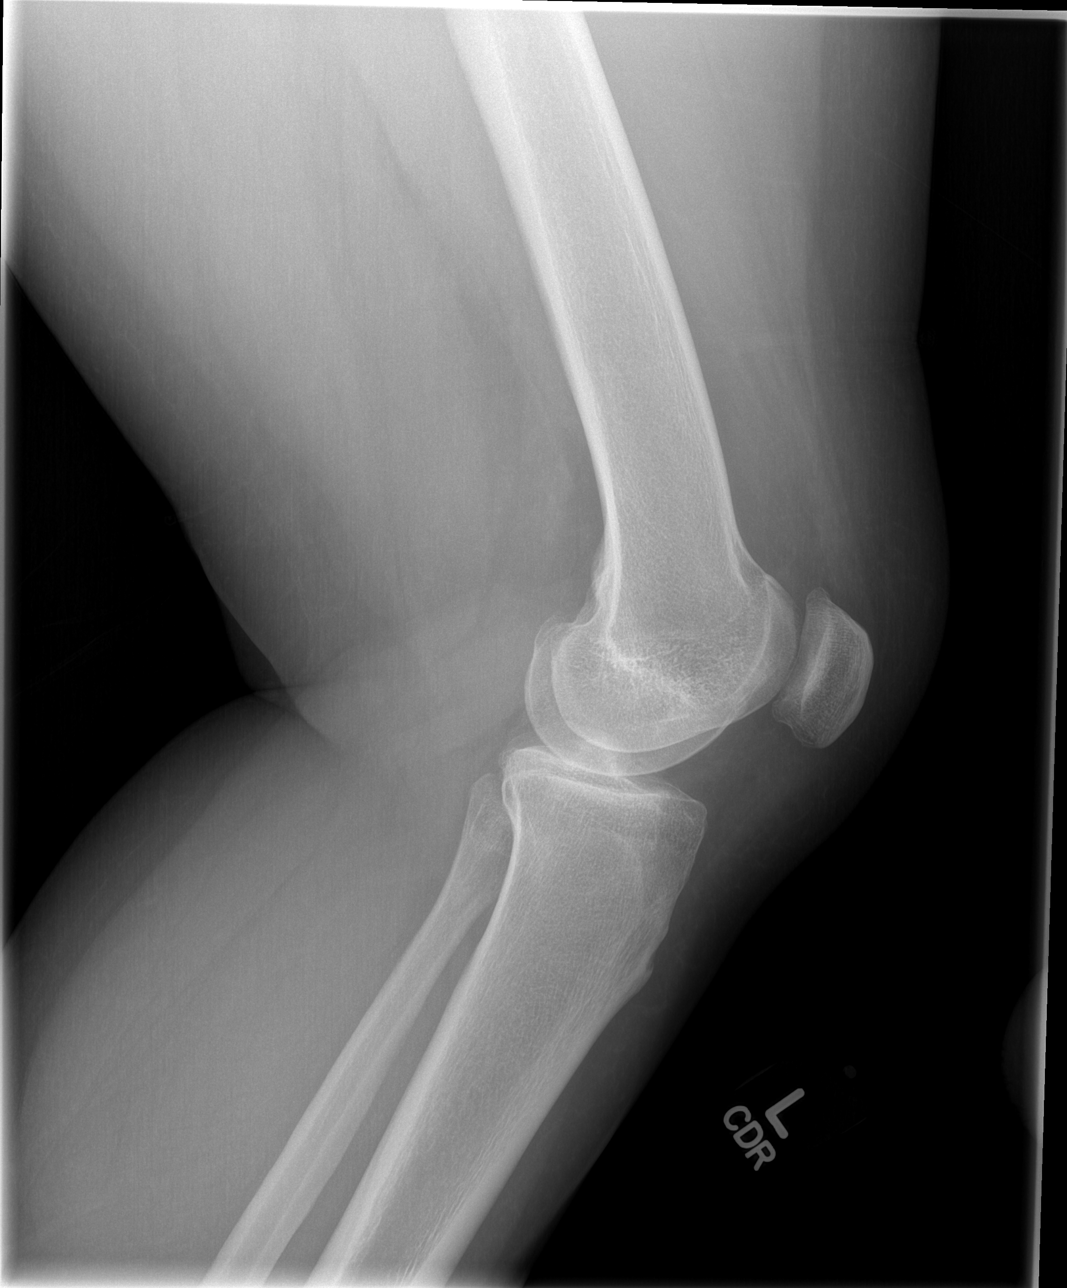

[4 of 4 positions shown; findings below may reference images not displayed]

FINDINGS: No fracture or malalignment. Minimal degenerative spurring medial
compartment and patellofemoral compartment. No large knee effusion.
IMPRESSION: Minimal degenerative changes.  No acute osseous abnormality.

## 2019-10-01 DIAGNOSIS — E119 Type 2 diabetes mellitus without complications: Secondary | ICD-10-CM | POA: Insufficient documentation

## 2020-03-18 LAB — RESULTS CONSOLE HPV: CHL HPV: NEGATIVE

## 2020-03-18 LAB — HM PAP SMEAR: HM Pap smear: NORMAL

## 2020-12-28 ENCOUNTER — Ambulatory Visit: Payer: Medicaid Other | Admitting: Nurse Practitioner

## 2021-04-09 ENCOUNTER — Emergency Department (HOSPITAL_COMMUNITY)
Admission: EM | Admit: 2021-04-09 | Discharge: 2021-04-09 | Disposition: A | Discharge status: Home / Self Care | Payer: Medicaid Other | Attending: Student | Admitting: Student

## 2021-04-09 DIAGNOSIS — Z20822 Contact with and (suspected) exposure to covid-19: Secondary | ICD-10-CM | POA: Insufficient documentation

## 2021-04-09 DIAGNOSIS — J029 Acute pharyngitis, unspecified: Secondary | ICD-10-CM | POA: Diagnosis present

## 2021-04-09 DIAGNOSIS — J020 Streptococcal pharyngitis: Secondary | ICD-10-CM | POA: Insufficient documentation

## 2021-04-09 DIAGNOSIS — R0602 Shortness of breath: Secondary | ICD-10-CM | POA: Insufficient documentation

## 2021-04-09 LAB — GROUP A STREP BY PCR: Group A Strep by PCR: DETECTED — AB

## 2021-04-09 LAB — RESP PANEL BY RT-PCR (FLU A&B, COVID) ARPGX2
Influenza A by PCR: NEGATIVE
Influenza B by PCR: NEGATIVE
SARS Coronavirus 2 by RT PCR: NEGATIVE

## 2021-04-09 MED ORDER — DEXAMETHASONE 10 MG/ML FOR PEDIATRIC ORAL USE
10.0000 mg | Freq: Once | INTRAMUSCULAR | Status: AC
  Administered 2021-04-09: 10 mg via ORAL
  Filled 2021-04-09: qty 1

## 2021-04-09 MED ORDER — NAPROXEN 375 MG PO TABS: 375.0000 mg | ORAL_TABLET | Freq: Two times a day (BID) | ORAL | 0 refills | Status: DC

## 2021-04-09 MED ORDER — DEXAMETHASONE 1 MG/ML PO CONC: 10.0000 mg | Freq: Once | ORAL | Status: DC

## 2021-04-09 MED ORDER — LIDOCAINE VISCOUS HCL 2 % MT SOLN
15.0000 mL | Freq: Once | OROMUCOSAL | Status: AC
  Administered 2021-04-09: 15 mL via OROMUCOSAL
  Filled 2021-04-09: qty 15

## 2021-04-09 MED ORDER — DEXAMETHASONE SODIUM PHOSPHATE 10 MG/ML IJ SOLN
INTRAMUSCULAR | Status: AC
  Filled 2021-04-09: qty 1

## 2021-04-09 MED ORDER — PENICILLIN G BENZATHINE 1200000 UNIT/2ML IM SUSY
1.2000 10*6.[IU] | PREFILLED_SYRINGE | Freq: Once | INTRAMUSCULAR | Status: AC
  Administered 2021-04-09: 1.2 10*6.[IU] via INTRAMUSCULAR
  Filled 2021-04-09: qty 2

## 2021-04-09 MED ORDER — KETOROLAC TROMETHAMINE 15 MG/ML IJ SOLN
15.0000 mg | Freq: Once | INTRAMUSCULAR | Status: AC
  Administered 2021-04-09: 15 mg via INTRAMUSCULAR
  Filled 2021-04-09: qty 1

## 2021-04-09 NOTE — ED Provider Notes (Signed)
[facility] EMERGENCY DEPARTMENT Provider Note  CSN: [id number] Arrival date & time: 04/09/21 [id number]  Chief Complaint(s) Shortness of Breath and Sore Throat  HPI Kaitlyn Zimmerman is a 36 y.o. female who presents emergency department for evaluation of sore throat and dysphagia.  Patient states that her symptoms began 4 days ago and have been progressively worsening.  Symptoms began with sore throat but now she feels a painful swelling and back of her throat primarily on the left as well as pain at the angle of the mandible upon opening the mouth.  Patient has no trismus.  She states that she needs to swallow twice to get liquid down.  Denies chest pain, abdominal pain, nausea, vomiting, diarrhea, fever or other systemic symptoms.   Shortness of Breath Associated symptoms: sore throat   Sore Throat   Past Medical History Past Medical History:  Diagnosis Date   Bell's palsy    Patient Active Problem List   Diagnosis Date Noted   Bell's palsy 09/09/2013   Facial pain 09/09/2013   Home Medication(s) Prior to Admission medications   Medication Sig Start Date End Date Taking? Authorizing Provider  benzonatate (TESSALON) 100 MG capsule Take 1 capsule (100 mg total) by mouth 3 (three) times daily as needed for cough. 07/20/14   Julianne Rice, MD  fluconazole (DIFLUCAN) 150 MG tablet Take 1 tablet today and repeat within 72 hours if symptoms have not improved. 05/08/18   Petrucelli, Samantha R, PA-C  gabapentin (NEURONTIN) 300 MG capsule Take 1 cap at bedtime for 1 week, then increase to 2 caps at bedtime 09/09/13   Cameron Sprang, MD  HYDROcodone-acetaminophen (NORCO/VICODIN) 5-325 MG tablet Take 1-2 tablets by mouth every 6 (six) hours as needed for moderate pain or severe pain. 10/28/17   Duffy Bruce, MD  ibuprofen (ADVIL,MOTRIN) 600 MG tablet Take 1 tablet (600 mg total) by mouth every 6 (six) hours as needed. 07/20/14   Julianne Rice, MD  methocarbamol (ROBAXIN) 500 MG  tablet Take 1 tablet (500 mg total) by mouth every 8 (eight) hours as needed for muscle spasms. 05/08/18   Petrucelli, Samantha R, PA-C  naproxen (NAPROSYN) 500 MG tablet Take 1 tablet (500 mg total) by mouth 2 (two) times daily. 05/08/18   Petrucelli, Glynda Jaeger, PA-C                                                                                                                                    Past Surgical History Past Surgical History:  Procedure Laterality Date   CHOLECYSTECTOMY     Family History Family History  Adopted: Yes    Social History Social History   Tobacco Use   Smoking status: Never   Smokeless tobacco: Never  Substance Use Topics   Alcohol use: Yes    Comment: occasional   Drug use: No   Allergies Patient has no known allergies.  Review of Systems Review  of Systems  HENT:  Positive for facial swelling and sore throat.    Physical Exam Vital Signs  I have reviewed the triage vital signs BP (!) 141/104 (BP Location: Right Arm)    Pulse (!) 129    Temp 98.8 F (37.1 C) (Oral)    Resp 18    LMP 02/28/2021    SpO2 96%   Physical Exam Vitals and nursing note reviewed.  Constitutional:      General: She is not in acute distress.    Appearance: She is well-developed.  HENT:     Head: Normocephalic and atraumatic.     Mouth/Throat:     Pharynx: Posterior oropharyngeal erythema present.     Tonsils: No tonsillar exudate or tonsillar abscesses.  Eyes:     Conjunctiva/sclera: Conjunctivae normal.  Cardiovascular:     Rate and Rhythm: Normal rate and regular rhythm.     Heart sounds: No murmur heard. Pulmonary:     Effort: Pulmonary effort is normal. No respiratory distress.     Breath sounds: Normal breath sounds.  Abdominal:     Palpations: Abdomen is soft.     Tenderness: There is no abdominal tenderness.  Musculoskeletal:        General: No swelling.     Cervical back: Neck supple.  Skin:    General: Skin is warm and dry.     Capillary Refill:  Capillary refill takes less than 2 seconds.  Neurological:     Mental Status: She is alert.  Psychiatric:        Mood and Affect: Mood normal.    ED Results and Treatments Labs (all labs ordered are listed, but only abnormal results are displayed) Labs Reviewed  RESP PANEL BY RT-PCR (FLU A&B, COVID) ARPGX2  GROUP A STREP BY PCR                                                                                                                          Radiology No results found.  Pertinent labs & imaging results that were available during my care of the patient were reviewed by me and considered in my medical decision making (see MDM for details).  Medications Ordered in ED Medications  dexamethasone (DECADRON) 1 MG/ML solution 10 mg (has no administration in time range)  lidocaine (XYLOCAINE) 2 % viscous mouth solution 15 mL (has no administration in time range)  Procedures Procedures  (including critical care time)  Medical Decision Making / ED Course   This patient presents to the ED for concern of sore throat, this involves an extensive number of treatment options, and is a complaint that carries with it a high risk of complications and morbidity.  The differential diagnosis includes strep pharyngitis, influenza, COVID-19, PTA, RPA  MDM: Patient seen in the emergency department for evaluation of sore throat.  Physical exam reveals oropharyngeal erythema and tenderness along the cervical lymph node chain but no obvious uvular deviation or facial asymmetry.  COVID and flu negative.  Patient is strep positive.  Patient presentation is consistent with strep throat and she was treated with Bicillin.  Patient given oral Decadron for her swelling and viscous lidocaine for the pain.  She was then discharged with a prescription for naproxen.   Additional  history obtained:  -External records from outside source obtained and reviewed including: Chart review including previous notes, labs, imaging, consultation notes   Lab Tests: -I ordered, reviewed, and interpreted labs.   The pertinent results include:   Labs Reviewed  RESP PANEL BY RT-PCR (FLU A&B, COVID) ARPGX2  GROUP A STREP BY PCR    Medicines ordered and prescription drug management: Meds ordered this encounter  Medications   dexamethasone (DECADRON) 1 MG/ML solution 10 mg   lidocaine (XYLOCAINE) 2 % viscous mouth solution 15 mL    -I have reviewed the patients home medicines and have made adjustments as needed  Critical interventions none    Cardiac Monitoring: The patient was maintained on a cardiac monitor.  I personally viewed and interpreted the cardiac monitored which showed an underlying rhythm of: nsr  Social Determinants of Health:  Factors impacting patients care include: none   Reevaluation: After the interventions noted above, I reevaluated the patient and found that they have :improved  Co morbidities that complicate the patient evaluation  Past Medical History:  Diagnosis Date   Bell's palsy       Dispostion: I considered admission for this patient, but her strep pharyngitis been fully treated here in the emergency department and she is safe for outpatient follow-up.     Final Clinical Impression(s) / ED Diagnoses Final diagnoses:  None     @PCDICTATION @    Teressa Lower, MD 04/09/21 [phone]

## 2021-04-09 NOTE — ED Triage Notes (Signed)
Pt. Stated, Kaitlyn Zimmerman had a sore throat and now its so hard to swallow making it hard to swallow and breath

## 2021-04-09 NOTE — ED Provider Triage Note (Signed)
Emergency Medicine Provider Triage Evaluation Note  Kaitlyn Zimmerman , a 36 y.o. female  was evaluated in triage.  Pt complains of sore throat over the last couple of days.  Was seen at urgent care yesterday had a negative point-of-care strep yesterday.  No fevers or chills.  Patient does have sensation that her throat is closing.  No cough, chest pain, shortness of breath.  Review of Systems  Positive:  Negative: See above   Physical Exam  BP (!) 141/104 (BP Location: Right Arm)    Pulse (!) 129    Temp 98.8 F (37.1 C) (Oral)    Resp 18    LMP 02/28/2021    SpO2 96%  Gen:   Awake, no distress   Resp:  Normal effort  MSK:   Moves extremities without difficulty  Other:  There is evidence of pharyngeal erythema but no evidence of tonsillar exudate or hypertrophy.  Does have a hoarse voice but talking in complete sentences.  Medical Decision Making  Medically screening exam initiated at 10:18 AM.  Appropriate orders placed.  Kaitlyn Zimmerman was informed that the remainder of the evaluation will be completed by another provider, this initial triage assessment does not replace that evaluation, and the importance of remaining in the ED until their evaluation is complete.     Teressa Lower, [place] 04/09/21 1020

## 2021-12-06 ENCOUNTER — Emergency Department (HOSPITAL_COMMUNITY)
Admission: EM | Admit: 2021-12-06 | Discharge: 2021-12-07 | Discharge status: Against Medical Advice | Payer: Medicaid Other | Attending: Emergency Medicine | Admitting: Emergency Medicine

## 2021-12-06 ENCOUNTER — Emergency Department (HOSPITAL_COMMUNITY): Payer: Medicaid Other

## 2021-12-06 ENCOUNTER — Other Ambulatory Visit: Payer: Self-pay

## 2021-12-06 ENCOUNTER — Encounter (HOSPITAL_COMMUNITY): Payer: Self-pay

## 2021-12-06 DIAGNOSIS — M25511 Pain in right shoulder: Secondary | ICD-10-CM | POA: Diagnosis not present

## 2021-12-06 DIAGNOSIS — Y92410 Unspecified street and highway as the place of occurrence of the external cause: Secondary | ICD-10-CM | POA: Insufficient documentation

## 2021-12-06 DIAGNOSIS — M25561 Pain in right knee: Secondary | ICD-10-CM | POA: Insufficient documentation

## 2021-12-06 DIAGNOSIS — R109 Unspecified abdominal pain: Secondary | ICD-10-CM | POA: Diagnosis not present

## 2021-12-06 DIAGNOSIS — M542 Cervicalgia: Secondary | ICD-10-CM | POA: Diagnosis not present

## 2021-12-06 DIAGNOSIS — R519 Headache, unspecified: Secondary | ICD-10-CM | POA: Diagnosis not present

## 2021-12-06 DIAGNOSIS — M25512 Pain in left shoulder: Secondary | ICD-10-CM | POA: Insufficient documentation

## 2021-12-06 DIAGNOSIS — Z5321 Procedure and treatment not carried out due to patient leaving prior to being seen by health care provider: Secondary | ICD-10-CM | POA: Diagnosis not present

## 2021-12-06 DIAGNOSIS — M25562 Pain in left knee: Secondary | ICD-10-CM | POA: Diagnosis not present

## 2021-12-06 LAB — I-STAT BETA HCG BLOOD, ED (MC, WL, AP ONLY): I-stat hCG, quantitative: <5 m[IU]/mL (ref <5)

## 2021-12-06 LAB — I-STAT CHEM 8, ED
BUN: 5 mg/dL — ABNORMAL LOW (ref 6–20)
Calcium, Ion: 1.15 mmol/L (ref 1.15–1.40)
Chloride: 103 mmol/L (ref 98–111)
Creatinine, Ser: 0.5 mg/dL (ref 0.44–1.00)
Glucose, Bld: 128 mg/dL — ABNORMAL HIGH (ref 70–99)
HCT: 40 % (ref 36.0–46.0)
Hemoglobin: 13.6 g/dL (ref 12.0–15.0)
Potassium: 3.7 mmol/L (ref 3.5–5.1)
Sodium: 139 mmol/L (ref 135–145)
TCO2: 25 mmol/L (ref 22–32)

## 2021-12-06 MED ORDER — IOHEXOL 350 MG/ML SOLN
75.0000 mL | Freq: Once | INTRAVENOUS | Status: AC | PRN
  Administered 2021-12-06: 75 mL via INTRAVENOUS

## 2021-12-06 NOTE — ED Notes (Signed)
Pt wants to leave. Encouraged to stay. Informed of risks and signs AMA form. Triage PA notified and asked to clear for C-Collar removal. Collar removed and IV removed. Pt seen leaving lobby with family.
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
## 2021-12-08 ENCOUNTER — Emergency Department (HOSPITAL_COMMUNITY): Payer: Medicaid Other

## 2021-12-08 ENCOUNTER — Emergency Department (HOSPITAL_COMMUNITY)
Admission: EM | Admit: 2021-12-08 | Discharge: 2021-12-08 | Disposition: A | Discharge status: Home / Self Care | Payer: Medicaid Other | Attending: Emergency Medicine | Admitting: Emergency Medicine

## 2021-12-08 ENCOUNTER — Encounter (HOSPITAL_COMMUNITY): Payer: Self-pay

## 2021-12-08 ENCOUNTER — Other Ambulatory Visit: Payer: Self-pay

## 2021-12-08 DIAGNOSIS — S0003XA Contusion of scalp, initial encounter: Secondary | ICD-10-CM | POA: Diagnosis not present

## 2021-12-08 DIAGNOSIS — M542 Cervicalgia: Secondary | ICD-10-CM | POA: Diagnosis not present

## 2021-12-08 DIAGNOSIS — Y92410 Unspecified street and highway as the place of occurrence of the external cause: Secondary | ICD-10-CM | POA: Diagnosis not present

## 2021-12-08 DIAGNOSIS — R600 Localized edema: Secondary | ICD-10-CM | POA: Diagnosis not present

## 2021-12-08 DIAGNOSIS — Z79899 Other long term (current) drug therapy: Secondary | ICD-10-CM | POA: Diagnosis not present

## 2021-12-08 DIAGNOSIS — I10 Essential (primary) hypertension: Secondary | ICD-10-CM | POA: Diagnosis not present

## 2021-12-08 DIAGNOSIS — S0990XA Unspecified injury of head, initial encounter: Secondary | ICD-10-CM | POA: Diagnosis present

## 2021-12-08 MED ORDER — KETOROLAC TROMETHAMINE 30 MG/ML IJ SOLN
30.0000 mg | Freq: Once | INTRAMUSCULAR | Status: AC
  Administered 2021-12-08: 30 mg via INTRAMUSCULAR
  Filled 2021-12-08: qty 1

## 2021-12-08 MED ORDER — TETRACAINE HCL 0.5 % OP SOLN
1.0000 [drp] | Freq: Once | OPHTHALMIC | Status: AC
Start: 2021-12-08 — End: 2021-12-08
  Administered 2021-12-08: 1 [drp] via OPHTHALMIC
  Filled 2021-12-08: qty 4
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
## 2021-12-08 NOTE — Discharge Instructions (Addendum)
You have been seen today for your complaint of headache and eye swelling. Your imaging was reassuring and showed no abnormalities. Your discharge medications include ibuprofen or Aleve.  You should follow-up with the dosing on the bottle.  .  May also take Tylenol at the same time.  I suggest alternating the 2 every 4 hours. Home care instructions are as follows:  You should continue to ice the affected area for 20 minutes at a time and keep your head elevated Follow up with: Primary care provider in the next week Please seek immediate medical care if you develop any of the following symptoms: You have: Loss of feeling (numbness), tingling, or weakness in your arms or legs. Very bad neck pain, especially tenderness in the middle of the back of your neck. A change in your ability to control your pee or poop (stool). More pain in any area of your body. Swelling in any area of your body, especially your legs. Shortness of breath or light-headedness. Chest pain. Blood in your pee, poop, or vomit. Very bad pain in your belly (abdomen) or your back. Very bad headaches or headaches that are getting worse. Sudden vision loss or double vision. Your eye suddenly turns red. The black center of your eye (pupil) is an odd shape or size. At this time there does not appear to be the presence of an emergent medical condition, however there is always the potential for conditions to change. Please read and follow the below instructions.  Do not take your medicine if  develop an itchy rash, swelling in your mouth or lips, or difficulty breathing; call 911 and seek immediate emergency medical attention if this occurs.  You may review your lab tests and imaging results in their entirety on your MyChart account.  Please discuss all results of fully with your primary care provider and other specialist at your follow-up visit.  Note: Portions of this text may have been transcribed using voice recognition  software. Every effort was made to ensure accuracy; however, inadvertent computerized transcription errors may still be present.

## 2021-12-20 ENCOUNTER — Encounter: Payer: Self-pay | Admitting: Nurse Practitioner

## 2022-07-27 ENCOUNTER — Ambulatory Visit (HOSPITAL_COMMUNITY)
Admission: EM | Admit: 2022-07-27 | Discharge: 2022-07-27 | Disposition: A | Discharge status: Home / Self Care | Payer: Medicaid Other

## 2022-07-27 ENCOUNTER — Other Ambulatory Visit: Payer: Self-pay

## 2022-07-27 ENCOUNTER — Encounter (HOSPITAL_COMMUNITY): Payer: Self-pay | Admitting: Emergency Medicine

## 2022-07-27 DIAGNOSIS — G5603 Carpal tunnel syndrome, bilateral upper limbs: Secondary | ICD-10-CM | POA: Diagnosis not present

## 2022-07-27 HISTORY — DX: Type 2 diabetes mellitus without complications: E11.9

## 2022-07-27 MED ORDER — METHYLPREDNISOLONE 4 MG PO TBPK: ORAL_TABLET | ORAL | 0 refills | Status: DC

## 2022-07-27 NOTE — ED Triage Notes (Signed)
Right hand tingling for 3 weeks.  Patient came today since pain and tingling sensation is radiating up right wrist.  Patient is right handed.    Denies taking any medications for symptoms .  Denies any injury

## 2022-07-27 NOTE — Discharge Instructions (Addendum)
I have included some information about carpal tunnel syndrome how to prevent this from reoccurring and information about release surgery which would be the final step of treatment if conservative treatment does not help.  Conservative treatment includes splinting your wrists, applying ice frequently to your wrist, gentle stretching, physical therapy and acute anti-inflammatory treatment with anti-inflammatory medications.  As we discussed, I personally feel that steroids are the best way to quickly address inflammation related to carpal tunnel syndrome.    I sent a prescription for methylprednisolone, a steroid to your pharmacy.  The instructions will be on the package but just to be clear, please take 6 tablets twice daily on day 1, 5 tablets twice daily on day 2, 4 tablets twice daily on day 3, 3 tablets twice daily on day 4, 2 tablets twice daily on day 5 and 1 tablet twice daily on day 6.  Please also keep 2 bottles of water present at all times so that whenever you are not actively working or sleeping, you apply ice to both wrists.  Ice is an excellent, fast acting anti-inflammatory treatment that would give you significant relief of the swelling of your carpal tunnel which will improve your nerve function in your fingers and hands.  Please talk with your primary care provider about possible referral to physical therapy once you are feeling better to find ways to strengthen your forearms and perform your work in a more ergonomic way to prevent carpal tunnel from reoccurring.  Thank you for visiting [facility] today.  We appreciate the opportunity participate in your care.

## 2022-07-27 NOTE — ED Provider Notes (Signed)
[facility]    CSN: [id number] Arrival date & time: 07/27/22  1522    HISTORY   Chief Complaint  Patient presents with   Hand Pain   HPI Kaitlyn Zimmerman is a pleasant, 37 y.o. female who presents to urgent care today. Patient complains of right hand tingling for 3 weeks and left hand tingling for the past few days but states it is not nearly as bad as her right hand, states she is right-hand dominant.  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  Patient came today since pain and tingling sensation has not began to radiate up her right forearm.  Patient states she has not tried any medications or therapy to alleviate the symptoms.  Patient states she she has never had this in the past.  Patient denies prior injury to either wrist.    The history is provided by the patient.   Past Medical History:  Diagnosis Date   Bell's palsy    Diabetes mellitus without complication [facility])    Patient Active Problem List   Diagnosis Date Noted   Bell's palsy 09/09/2013   Facial pain 09/09/2013   Past Surgical History:  Procedure Laterality Date   CHOLECYSTECTOMY     OB History   No obstetric history on file.    Home Medications    Prior to Admission medications   Medication Sig Start Date End Date Taking? Authorizing Provider  hydrOXYzine (VISTARIL) 25 MG capsule TAKE ONE CAPSULE BY MOUTH TWICE A DAY AS NEEDED FOR AGITATION AND/OR SLEEP 05/30/22  Yes [provider]  benzonatate (TESSALON) 100 MG capsule Take 1 capsule (100 mg total) by mouth 3 (three) times daily as needed for cough. Patient not taking: Reported on 07/27/2022 07/20/14   Loren Racer, MD  fluconazole (DIFLUCAN) 150 MG tablet Take 1 tablet today and repeat within 72 hours if symptoms have not improved. Patient not taking: Reported on 07/27/2022 05/08/18   Petrucelli, Lelon Mast R, PA-C  gabapentin (NEURONTIN) 300 MG capsule  Take 1 cap at bedtime for 1 week, then increase to 2 caps at bedtime Patient not taking: Reported on 07/27/2022 09/09/13   Van Clines, MD  HYDROcodone-acetaminophen (NORCO/VICODIN) 5-325 MG tablet Take 1-2 tablets by mouth every 6 (six) hours as needed for moderate pain or severe pain. Patient not taking: Reported on 07/27/2022 10/28/17   Shaune Pollack, MD  ibuprofen (ADVIL,MOTRIN) 600 MG tablet Take 1 tablet (600 mg total) by mouth every 6 (six) hours as needed. 07/20/14   Loren Racer, MD  methocarbamol (ROBAXIN) 500 MG tablet Take 1 tablet (500 mg total) by mouth every 8 (eight) hours as needed for muscle spasms. Patient not taking: Reported on 07/27/2022 05/08/18   Petrucelli, Pleas Koch, PA-C  naproxen (NAPROSYN) 375 MG tablet Take 1 tablet (375 mg total) by mouth 2 (two) times daily. Patient not taking: Reported on 07/27/2022 04/09/21   Kommor, Wyn Forster, MD    Family History Family History  Adopted: Yes   Social History Social History   Tobacco Use   Smoking status: Never   Smokeless tobacco: Never  Vaping Use   Vaping Use: Never used  Substance Use Topics   Alcohol use: Yes    Comment: occasional   Drug use: No   Allergies   Patient has no known allergies.  Review of Systems Review of Systems Pertinent findings revealed after performing a 14 point review of  systems has been noted in the history of present illness.  Physical Exam Vital Signs BP (!) 145/89 (BP Location: Right Arm) Comment (BP Location): large cuff  Pulse 83   Temp 98.2 F (36.8 C) (Oral)   Resp 20   LMP 07/10/2022   SpO2 97%   No data found.  Physical Exam Vitals and nursing note reviewed.  Constitutional:      General: She is not in acute distress.    Appearance: Normal appearance.  HENT:     Head: Normocephalic and atraumatic.  Eyes:     Pupils: Pupils are equal, round, and reactive to light.  Cardiovascular:     Rate and Rhythm: Normal rate and regular rhythm.  Pulmonary:     Effort:  Pulmonary effort is normal.     Breath sounds: Normal breath sounds.  Musculoskeletal:        General: Normal range of motion.     Right wrist: Normal. No swelling, deformity, effusion, tenderness, bony tenderness, snuff box tenderness or crepitus. Normal range of motion.     Left wrist: Normal. No swelling, deformity, effusion, tenderness, bony tenderness, snuff box tenderness or crepitus. Normal range of motion.     Right hand: Normal. No swelling, deformity, lacerations, tenderness or bony tenderness. Normal range of motion. Normal strength. Normal sensation. There is no disruption of two-point discrimination. Normal capillary refill.     Left hand: Normal. No swelling, deformity, lacerations, tenderness or bony tenderness. Normal range of motion. Normal strength. Normal sensation. There is no disruption of two-point discrimination. Normal capillary refill.     Cervical back: Normal range of motion and neck supple.     Comments: Positive Tinel, positive Phalen bilaterally  Skin:    General: Skin is warm and dry.  Neurological:     General: No focal deficit present.     Mental Status: She is alert and oriented to person, place, and time. Mental status is at baseline.  Psychiatric:        Mood and Affect: Mood normal.        Behavior: Behavior normal.        Thought Content: Thought content normal.        Judgment: Judgment normal.     UC Couse / Diagnostics / Procedures:     Radiology No results found.  Procedures Procedures (including critical care time) EKG  Pending results:  Labs Reviewed - No data to display  Medications Ordered in UC: Medications - No data to display  UC Diagnoses / Final Clinical Impressions(s)   I have reviewed the triage vital signs and the nursing notes.  Pertinent labs & imaging results that were available during my care of the patient were reviewed by me and considered in my medical decision making (see chart for details).    Final diagnoses:   Bilateral carpal tunnel syndrome    Patient provided with bilateral cock-up wrist splints, advised to ice her wrist is much as possible and begin methylprednisolone.  Patient advised if no better to follow-up with orthopedics.  Please see discharge instructions below for details of plan of care as provided to patient. ED Prescriptions     Medication Sig Dispense Auth. Provider   methylPREDNISolone (MEDROL DOSEPAK) 4 MG TBPK tablet Take 24 mg q 12h on day 1, 20 mg q 12h on day 2, 16 mg q 12h on day 3, 12 mg q 12h on day 4, 8 mg q 12h on day 5, 4 mg q 12h on day  6.  Take all tablets in each row at once, take with food. 21 tablet Theadora Rama Scales, PA-C      PDMP not reviewed this encounter.  Discharge Instructions:   Discharge Instructions      I have included some information about carpal tunnel syndrome how to prevent this from reoccurring and information about release surgery which would be the final step of treatment if conservative treatment does not help.  Conservative treatment includes splinting your wrists, applying ice frequently to your wrist, gentle stretching, physical therapy and acute anti-inflammatory treatment with anti-inflammatory medications.  As we discussed, I personally feel that steroids are the best way to quickly address inflammation related to carpal tunnel syndrome.    I sent a prescription for methylprednisolone, a steroid to your pharmacy.  The instructions will be on the package but just to be clear, please take 6 tablets twice daily on day 1, 5 tablets twice daily on day 2, 4 tablets twice daily on day 3, 3 tablets twice daily on day 4, 2 tablets twice daily on day 5 and 1 tablet twice daily on day 6.  Please also keep 2 bottles of water present at all times so that whenever you are not actively working or sleeping, you apply ice to both wrists.  Ice is an excellent, fast acting anti-inflammatory treatment that would give you significant relief of the  swelling of your carpal tunnel which will improve your nerve function in your fingers and hands.  Please talk with your primary care provider about possible referral to physical therapy once you are feeling better to find ways to strengthen your forearms and perform your work in a more ergonomic way to prevent carpal tunnel from reoccurring.  Thank you for visiting [facility] today.  We appreciate the opportunity participate in your care.          Disposition Upon Discharge:  Condition: stable for discharge home Home: take medications as prescribed; routine discharge instructions as discussed; follow up as advised.  Patient presented with an acute illness with associated systemic symptoms and significant discomfort requiring urgent management. In my opinion, this is a condition that a prudent lay person (someone who possesses an average knowledge of health and medicine) may potentially expect to result in complications if not addressed urgently such as respiratory distress, impairment of bodily function or dysfunction of bodily organs.   Routine symptom specific, illness specific and/or disease specific instructions were discussed with the patient and/or caregiver at length.   As such, the patient has been evaluated and assessed, work-up was performed and treatment was provided in alignment with urgent care protocols and evidence based medicine.  Patient/parent/caregiver has been advised that the patient may require follow up for further testing and treatment if the symptoms continue in spite of treatment, as clinically indicated and appropriate.  Patient/parent/caregiver has been advised to report to orthopedic urgent care clinic or return to the [facility] or PCP in 3-5 days if no better; follow-up with orthopedics, PCP or the Emergency Department if new signs and symptoms develop or if the current signs or symptoms continue to change or worsen for further workup, evaluation and  treatment as clinically indicated and appropriate  The patient will follow up with their current PCP if and as advised. If the patient does not currently have a PCP we will have assisted them in obtaining one.   The patient may need specialty follow up if the symptoms continue, in spite of conservative treatment  and management, for further workup, evaluation, consultation and treatment as clinically indicated and appropriate.  Patient/parent/caregiver verbalized understanding and agreement of plan as discussed.  All questions were addressed during visit.  Please see discharge instructions below for further details of plan.  This office note has been dictated using Teaching laboratory technician.  Unfortunately, this method of dictation can sometimes lead to typographical or grammatical errors.  I apologize for your inconvenience in advance if this occurs.  Please do not hesitate to reach out to me if clarification is needed.      Theadora Rama Scales, [place] 07/27/22 Rickey Primus

## 2023-09-16 ENCOUNTER — Encounter (HOSPITAL_COMMUNITY): Payer: Self-pay | Admitting: *Deleted

## 2023-09-16 ENCOUNTER — Emergency Department (HOSPITAL_COMMUNITY)
Admission: EM | Admit: 2023-09-16 | Discharge: 2023-09-16 | Disposition: A | Discharge status: Home / Self Care | Attending: Emergency Medicine | Admitting: Emergency Medicine

## 2023-09-16 ENCOUNTER — Other Ambulatory Visit: Payer: Self-pay

## 2023-09-16 DIAGNOSIS — R21 Rash and other nonspecific skin eruption: Secondary | ICD-10-CM | POA: Insufficient documentation

## 2023-09-16 LAB — BASIC METABOLIC PANEL WITH GFR
Anion gap: 14 (ref 5–15)
BUN: <5 mg/dL — ABNORMAL LOW (ref 6–20)
CO2: 27 mmol/L (ref 22–32)
Calcium: 9.3 mg/dL (ref 8.9–10.3)
Chloride: 98 mmol/L (ref 98–111)
Creatinine, Ser: 0.61 mg/dL (ref 0.44–1.00)
GFR, Estimated: >60 mL/min (ref >60)
Glucose, Bld: 275 mg/dL — ABNORMAL HIGH (ref 70–99)
Potassium: 3.5 mmol/L (ref 3.5–5.1)
Sodium: 139 mmol/L (ref 135–145)

## 2023-09-16 LAB — CBC WITH DIFFERENTIAL/PLATELET
Abs Immature Granulocytes: 0.04 K/uL (ref 0.00–0.07)
Basophils Absolute: 0 K/uL (ref 0.0–0.1)
Basophils Relative: 0 %
Eosinophils Absolute: 0.3 K/uL (ref 0.0–0.5)
Eosinophils Relative: 3 %
HCT: 38.8 % (ref 36.0–46.0)
Hemoglobin: 13.8 g/dL (ref 12.0–15.0)
Immature Granulocytes: 0 %
Lymphocytes Relative: 23 %
Lymphs Abs: 2.4 K/uL (ref 0.7–4.0)
MCH: 35.8 pg — ABNORMAL HIGH (ref 26.0–34.0)
MCHC: 35.6 g/dL (ref 30.0–36.0)
MCV: 100.8 fL — ABNORMAL HIGH (ref 80.0–100.0)
Monocytes Absolute: 0.7 K/uL (ref 0.1–1.0)
Monocytes Relative: 7 %
Neutro Abs: 6.9 K/uL (ref 1.7–7.7)
Neutrophils Relative %: 67 %
Platelets: 269 K/uL (ref 150–400)
RBC: 3.85 MIL/uL — ABNORMAL LOW (ref 3.87–5.11)
RDW: 12.8 % (ref 11.5–15.5)
WBC: 10.4 K/uL (ref 4.0–10.5)
nRBC: 0 % (ref 0.0–0.2)

## 2023-09-16 LAB — HIV ANTIBODY (ROUTINE TESTING W REFLEX): HIV Screen 4th Generation wRfx: NONREACTIVE

## 2023-09-16 MED ORDER — BACITRACIN ZINC 500 UNIT/GM EX OINT
TOPICAL_OINTMENT | Freq: Two times a day (BID) | CUTANEOUS | Status: DC
  Administered 2023-09-16: 1 via TOPICAL
  Filled 2023-09-16: qty 0.9

## 2023-09-16 MED ORDER — NYSTATIN 100000 UNIT/GM EX POWD
Freq: Once | CUTANEOUS | Status: AC
Start: 2023-09-16 — End: 2023-09-16
  Filled 2023-09-16: qty 15

## 2023-09-16 MED ORDER — BACITRACIN ZINC 500 UNIT/GM EX OINT: 1.0000 | TOPICAL_OINTMENT | Freq: Two times a day (BID) | CUTANEOUS | 0 refills | Status: DC

## 2023-09-16 MED ORDER — NYSTATIN 100000 UNIT/GM EX POWD: 1.0000 | Freq: Three times a day (TID) | CUTANEOUS | 0 refills | Status: DC

## 2023-09-16 NOTE — Discharge Instructions (Signed)
 Please follow-up outpatient with dermatology. Return to the ER if you develop fever and rash, rash that involves your mucous membranes, or any severe worsening pain or skin sloughing in the setting of a rash. You have some skin breakdown and the beginnings of what appears to be a yeast infection for which nystatin  has been prescribed. The results of your labs will be available on the patient portal. Follow-up with dermatology for an outpatient psoriasis workup.

## 2023-09-16 NOTE — ED Triage Notes (Signed)
 C/o rash on both arms and inner thighs bilaterally states it is now in her vaginal area

## 2023-09-16 NOTE — ED Provider Notes (Signed)
 Kaitlyn Zimmerman Provider Note   CSN: [id number] Arrival date & time: 09/16/23  1240     Patient presents with: Rash   Kaitlyn Zimmerman is a 38 y.o. female.    Rash    38 year old female presenting to the emergency department with chief complaint of rash.  The patient states that for the past 2 weeks she has had rash on her bilateral extremities which is extended to her inner thighs and abdomen underneath her breast bilaterally.  She denies any history of psoriasis, has had scaling lesions along the extensor aspects of her extremities for a long time.  Does not follow outpatient with a dermatologist.  She denies any fevers or chills, denies any changes in detergents or other potential exposure to irritants.  Denies any involvement of the palms or soles of her feet.  She is not concerned for sexually transmitted infection but consents to testing for the same for syphilis.  She states that the rash is spread to her groin, denies any vaginal mucosal involvement, denies any involvement of the mucosa of her mouth. She has been trying hydrocortisone cream without any relief.   Prior to Admission medications   Medication Sig Start Date End Date Taking? Authorizing Provider  bacitracin  ointment Apply 1 Application topically 2 (two) times daily. 09/16/23  Yes Jerrol Agent, MD  nystatin  (MYCOSTATIN /NYSTOP ) powder Apply 1 Application topically 3 (three) times daily. 09/16/23  Yes Jerrol Agent, MD  hydrOXYzine (VISTARIL) 25 MG capsule TAKE ONE CAPSULE BY MOUTH TWICE A DAY AS NEEDED FOR AGITATION AND/OR SLEEP 05/30/22   [provider]  ibuprofen  (ADVIL ,MOTRIN ) 600 MG tablet Take 1 tablet (600 mg total) by mouth every 6 (six) hours as needed. 07/20/14   Carlyle Lenis, MD  methylPREDNISolone  (MEDROL  DOSEPAK) 4 MG TBPK tablet Take 24 mg q 12h on day 1, 20 mg q 12h on day 2, 16 mg q 12h on day 3, 12 mg q 12h on day 4, 8 mg q 12h on day 5, 4 mg q 12h on day  6.  Take all tablets in each row at once, take with food. 07/27/22   Joesph Shaver Scales, PA-C    Allergies: Patient has no known allergies.    Review of Systems  Skin:  Positive for rash.  All other systems reviewed and are negative.   Updated Vital Signs BP 139/88   Pulse (!) 110   Temp 98.3 F (36.8 C) (Oral)   Resp 20   Ht 5' 9 (1.753 m)   Wt 131 kg   SpO2 100%   BMI 42.65 kg/m   Physical Exam Vitals and nursing note reviewed.  Constitutional:      General: She is not in acute distress.    Appearance: She is well-developed.  HENT:     Head: Normocephalic and atraumatic.  Eyes:     Conjunctiva/sclera: Conjunctivae normal.  Cardiovascular:     Rate and Rhythm: Normal rate and regular rhythm.  Pulmonary:     Effort: Pulmonary effort is normal. No respiratory distress.     Breath sounds: Normal breath sounds.  Abdominal:     Palpations: Abdomen is soft.     Tenderness: There is no abdominal tenderness.  Musculoskeletal:        General: No swelling.     Cervical back: Neck supple.  Skin:    General: Skin is warm and dry.     Capillary Refill: Capillary refill takes less than 2 seconds.  Findings: Rash present.     Comments: Maculopapular and purpuric rash along all four extremities, spares the palms and soles, involves the abdomen and groin, some skin breakdown in the groin, no mucous membrane involvement.   Scaling lesions along the extensor aspects of the elbows which the patient states is chronic  Neurological:     Mental Status: She is alert.  Psychiatric:        Mood and Affect: Mood normal.     (all labs ordered are listed, but only abnormal results are displayed) Labs Reviewed  CBC WITH DIFFERENTIAL/PLATELET - Abnormal; Notable for the following components:      Result Value   RBC 3.85 (*)    MCV 100.8 (*)    MCH 35.8 (*)    All other components within normal limits  BASIC METABOLIC PANEL WITH GFR - Abnormal; Notable for the following  components:   Glucose, Bld 275 (*)    BUN <5 (*)    All other components within normal limits  RPR  HIV ANTIBODY (ROUTINE TESTING W REFLEX)  SPOTTED FEVER GROUP ANTIBODIES    EKG: None  Radiology: No results found.   Procedures   Medications Ordered in the ED  bacitracin  ointment (1 Application Topical Given 09/16/23 1701)  nystatin  (MYCOSTATIN /NYSTOP ) topical powder ( Topical Given 09/16/23 1702)                                    Medical Decision Making Amount and/or Complexity of Data Reviewed Labs: ordered.  Risk OTC drugs. Prescription drug management.    38 year old female presenting to the emergency department with chief complaint of rash.  The patient states that for the past 2 weeks she has had rash on her bilateral extremities which is extended to her inner thighs and abdomen underneath her breast bilaterally.  She denies any history of psoriasis, has had scaling lesions along the extensor aspects of her extremities for a long time.  Does not follow outpatient with a dermatologist.  She denies any fevers or chills, denies any changes in detergents or other potential exposure to irritants.  Denies any involvement of the palms or soles of her feet.  She is not concerned for sexually transmitted infection but consents to testing for the same for syphilis.  She states that the rash is spread to her groin, denies any vaginal mucosal involvement, denies any involvement of the mucosa of her mouth. She has been trying hydrocortisone cream without any relief.   [name] is a 38 y.o. female who presents with rash as per above. I have reviewed the nursing documentation for past medical history, family history, and social history. I have reviewed the EMR and have learned that she has no history of this.  On arrival, she was vitally stable, Currently she is awake, alert, and hemodynamically stable. Her exam is most notable for Maculopapular and purpuric rash along all four  extremities, spares the palms and soles, involves the abdomen and groin, some skin breakdown in the groin, no mucous membrane involvement.  Scaling lesions along the extensor aspects of the elbows which the patient states is chronic.  Differential Diagnoses: Primarily I am concerned for underlying psoriasis. Her rash is consistent with a dermatitis. She does not have any vaginal mucosal involvement.    There are no red flag symptoms such as fever, mucosal involvement, hypotension, toxic appearance, severe pain, immunosuppression, or a concerning medication being  used. There are no physical exam findings or symptoms on ROS concerning for erythema multiforme, SJS/TEN, Lyme disease, cellulitis, necrotizing fasciitis, purupra fulminans, angioedema, anaphylaxis, meningococcemia, DRESS, or RMSF. I do not think that the patient is septic.  I believe the patient  is safe for discharge home. The patient feels safe with this plan. I prescribed bacitracin  and nystatin . I referred her to dermatology given her psoriasis like plaques along her elbows. She agreed to followup with their PCP and dermatology.      Final diagnoses:  Rash and nonspecific skin eruption    ED Discharge Orders          Ordered    Ambulatory referral to Dermatology        09/16/23 1447    bacitracin  ointment  2 times daily        09/16/23 1642    nystatin  (MYCOSTATIN /NYSTOP ) powder  3 times daily        09/16/23 1642               Jerrol Agent, MD 09/16/23 1726

## 2023-09-17 LAB — RPR: RPR Ser Ql: NONREACTIVE

## 2023-09-19 LAB — SPOTTED FEVER GROUP ANTIBODIES
Spotted Fever Group IgG: <1:64 {titer}
Spotted Fever Group IgM: <1:64 {titer}

## 2023-10-11 ENCOUNTER — Emergency Department

## 2023-10-11 ENCOUNTER — Emergency Department
Admission: EM | Admit: 2023-10-11 | Discharge: 2023-10-11 | Disposition: A | Discharge status: Home / Self Care | Attending: Emergency Medicine | Admitting: Emergency Medicine

## 2023-10-11 ENCOUNTER — Other Ambulatory Visit: Payer: Self-pay

## 2023-10-11 ENCOUNTER — Encounter: Payer: Self-pay | Admitting: Emergency Medicine

## 2023-10-11 DIAGNOSIS — R0789 Other chest pain: Secondary | ICD-10-CM | POA: Insufficient documentation

## 2023-10-11 DIAGNOSIS — E119 Type 2 diabetes mellitus without complications: Secondary | ICD-10-CM | POA: Insufficient documentation

## 2023-10-11 DIAGNOSIS — R079 Chest pain, unspecified: Secondary | ICD-10-CM

## 2023-10-11 LAB — BASIC METABOLIC PANEL WITH GFR
Anion gap: 9 (ref 5–15)
BUN: <5 mg/dL — ABNORMAL LOW (ref 6–20)
CO2: 27 mmol/L (ref 22–32)
Calcium: 8.9 mg/dL (ref 8.9–10.3)
Chloride: 103 mmol/L (ref 98–111)
Creatinine, Ser: 0.55 mg/dL (ref 0.44–1.00)
GFR, Estimated: >60 mL/min (ref >60)
Glucose, Bld: 165 mg/dL — ABNORMAL HIGH (ref 70–99)
Potassium: 4.3 mmol/L (ref 3.5–5.1)
Sodium: 139 mmol/L (ref 135–145)

## 2023-10-11 LAB — CBC
HCT: 40.9 % (ref 36.0–46.0)
Hemoglobin: 14.3 g/dL (ref 12.0–15.0)
MCH: 34.2 pg — ABNORMAL HIGH (ref 26.0–34.0)
MCHC: 35 g/dL (ref 30.0–36.0)
MCV: 97.8 fL (ref 80.0–100.0)
Platelets: 339 K/uL (ref 150–400)
RBC: 4.18 MIL/uL (ref 3.87–5.11)
RDW: 13.4 % (ref 11.5–15.5)
WBC: 12 K/uL — ABNORMAL HIGH (ref 4.0–10.5)
nRBC: 0 % (ref 0.0–0.2)

## 2023-10-11 LAB — TSH: TSH: 1.034 u[IU]/mL (ref 0.350–4.500)

## 2023-10-11 LAB — D-DIMER, QUANTITATIVE: D-Dimer, Quant: <0.27 ug{FEU}/mL (ref 0.00–0.50)

## 2023-10-11 LAB — TROPONIN I (HIGH SENSITIVITY): Troponin I (High Sensitivity): 6 ng/L (ref <18)

## 2023-10-11 MED ORDER — LORAZEPAM 1 MG PO TABS
1.0000 mg | ORAL_TABLET | Freq: Once | ORAL | Status: AC
  Administered 2023-10-11: 1 mg via ORAL
  Filled 2023-10-11: qty 1

## 2023-10-11 MED ORDER — LORAZEPAM 0.5 MG PO TABS: 0.5000 mg | ORAL_TABLET | Freq: Every day | ORAL | 0 refills | Status: AC | PRN

## 2023-10-11 NOTE — Discharge Instructions (Signed)
 Please follow-up with your doctor regarding today's ER visit.  You have been prescribed an anxiety medication, please only take this medication as written.  Do not drink alcohol or drive while taking this medication.

## 2023-10-11 NOTE — ED Triage Notes (Signed)
 Patient to ED via POV for mid CP that radiates under right breast. Symptoms started last PM. Also reports tightness in bilateral legs. Denies cardiac hx.

## 2023-10-11 NOTE — ED Provider Notes (Addendum)
 [facility] Provider Note    Event Date/Time   First MD Initiated Contact with Patient 10/11/23 1534     (approximate)  History   Chief Complaint: Chest Pain  HPI  Kaitlyn Zimmerman is a 38 y.o. female with a past medical history of diabetes, anxiety, depression presents to the emergency department for chest pain.  According to the patient since last night she has been experiencing a tightness discomfort across her chest.  Patient denies any shortness of breath denies any pleuritic pain.  Patient states she has been experiencing a tightness sensation in both of her calfs over the past few months as well.  No history of DVT no cardiac history.  No known family history.  Physical Exam   Triage Vital Signs: ED Triage Vitals  Encounter Vitals Group     BP 10/11/23 1420 120/87     Girls Systolic BP Percentile --      Girls Diastolic BP Percentile --      Boys Systolic BP Percentile --      Boys Diastolic BP Percentile --      Pulse Rate 10/11/23 1420 (!) 118     Resp 10/11/23 1420 18     Temp 10/11/23 1420 98.4 F (36.9 C)     Temp Source 10/11/23 1420 Oral     SpO2 10/11/23 1420 97 %     Weight 10/11/23 1421 235 lb (106.6 kg)     Height 10/11/23 1421 5' 9 (1.753 m)     Head Circumference --      Peak Flow --      Pain Score 10/11/23 1421 8     Pain Loc --      Pain Education --      Exclude from Growth Chart --     Most recent vital signs: Vitals:   10/11/23 1420  BP: 120/87  Pulse: (!) 118  Resp: 18  Temp: 98.4 F (36.9 C)  SpO2: 97%    General: Awake, no distress.  CV:  Good peripheral perfusion.  Regular rate and rhythm  Resp:  Normal effort.  Equal breath sounds bilaterally.  Abd:  No distention.  Soft, nontender.  No rebound or guarding.  ED Results / Procedures / Treatments   EKG  EKG viewed and interpreted by myself shows a sinus tachycardia at 123 bpm with a narrow QRS, normal axis, normal intervals, nonspecific ST changes.  No  ST elevation.  RADIOLOGY  I have reviewed and interpreted chest x-ray images.  No consolidation on my evaluation. Radiology is read the x-ray as negative   MEDICATIONS ORDERED IN ED: Medications  LORazepam  (ATIVAN ) tablet 1 mg (has no administration in time range)     IMPRESSION / MDM / ASSESSMENT AND PLAN / ED COURSE  I reviewed the triage vital signs and the nursing notes.  Patient's presentation is most consistent with acute presentation with potential threat to life or bodily function.  Patient presents to the emergency department for chest discomfort described more as a tightness sensation across the chest.  Patient states symptoms have been ongoing since last night.  Patient denies any shortness of breath currently denies any pleuritic pain at any point.  Patient does state over the last month or so she has noticed a tightness sensation to both of her calfs.  No calf pain.  No appreciable swelling on my exam.  Patient's workup overall reassuring with a reassuring CBC reassuring chemistry and a negative troponin.  Chest x-ray  is clear.  EKG shows no concerning morphology changes but it is tachycardic around 120 bpm.  Pulse rate during my evaluation also around 110 to 120 bpm.  Patient does admit anxiety.  However given the tachycardia and chest discomfort we will obtain a D-dimer as a precaution.  If the D-dimer is negative I believe patient can be safely discharged home with outpatient follow-up.  If D-dimer is positive patient will need a CTA of the chest to further evaluate.  Patient also states anxiety, will dose 1 mg of oral Ativan , patient states her son will drive her home, to see if this helps with any the patient's symptoms while we await the D-dimer result.  Patient D-dimer is negative.  Workup is reassuring.  Given the patient's reassuring workup I believe the patient can be safely discharged home with outpatient follow-up.  Patient states she is feeling much better after the  medication.  Will discharge with a short course of hydroxyzine to be used as needed.  Patient's TSH has resulted negative.  Patient has a very reassuring workup we will discharge home have the patient follow-up with her doctor.  FINAL CLINICAL IMPRESSION(S) / ED DIAGNOSES   Chest pain    Note:  This document was prepared using Dragon voice recognition software and may include unintentional dictation errors.   Dorothyann Drivers, MD 10/11/23 1642    Dorothyann Drivers, MD 10/11/23 1725

## 2023-12-28 ENCOUNTER — Other Ambulatory Visit: Payer: Self-pay

## 2023-12-28 ENCOUNTER — Other Ambulatory Visit (HOSPITAL_BASED_OUTPATIENT_CLINIC_OR_DEPARTMENT_OTHER): Payer: Self-pay

## 2023-12-28 ENCOUNTER — Emergency Department

## 2023-12-28 ENCOUNTER — Emergency Department
Admission: EM | Admit: 2023-12-28 | Discharge: 2023-12-28 | Disposition: A | Discharge status: Home / Self Care | Attending: Emergency Medicine | Admitting: Emergency Medicine

## 2023-12-28 DIAGNOSIS — R531 Weakness: Secondary | ICD-10-CM | POA: Insufficient documentation

## 2023-12-28 DIAGNOSIS — R202 Paresthesia of skin: Secondary | ICD-10-CM | POA: Diagnosis present

## 2023-12-28 DIAGNOSIS — R29898 Other symptoms and signs involving the musculoskeletal system: Secondary | ICD-10-CM

## 2023-12-28 DIAGNOSIS — E119 Type 2 diabetes mellitus without complications: Secondary | ICD-10-CM | POA: Insufficient documentation

## 2023-12-28 DIAGNOSIS — M545 Low back pain, unspecified: Secondary | ICD-10-CM | POA: Insufficient documentation

## 2023-12-28 MED ORDER — CYCLOBENZAPRINE HCL 10 MG PO TABS
5.0000 mg | ORAL_TABLET | Freq: Once | ORAL | Status: AC
  Administered 2023-12-28: 5 mg via ORAL
  Filled 2023-12-28: qty 1

## 2023-12-28 MED ORDER — KETOROLAC TROMETHAMINE 30 MG/ML IJ SOLN
30.0000 mg | Freq: Once | INTRAMUSCULAR | Status: AC
  Administered 2023-12-28: 30 mg via INTRAMUSCULAR
  Filled 2023-12-28: qty 1

## 2023-12-28 MED ORDER — CYCLOBENZAPRINE HCL 5 MG PO TABS
5.0000 mg | ORAL_TABLET | Freq: Three times a day (TID) | ORAL | 0 refills | Status: AC | PRN
  Filled 2023-12-28: qty 30, 10d supply, fill #0

## 2023-12-28 MED ORDER — ACETAMINOPHEN 325 MG PO TABS
650.0000 mg | ORAL_TABLET | Freq: Once | ORAL | Status: AC
  Administered 2023-12-28: 650 mg via ORAL
  Filled 2023-12-28: qty 2

## 2023-12-28 NOTE — ED Triage Notes (Signed)
 Pt sts that she has been having numbness in her hands and feet however pt sts that last night she started to have pain in her right hip. Pt is ambulatory with no assistance needed.

## 2023-12-28 NOTE — Discharge Instructions (Signed)
 Your MRI reveals moderate left subarticular recess and left foraminal stenosis at L4-L5 and mild bilateral foraminal stenosis at L5-S1.  Please follow-up with neurosurgery for further evaluation.

## 2023-12-28 NOTE — ED Provider Notes (Signed)
 [facility] Emergency Department Provider Note     Event Date/Time   First MD Initiated Contact with Patient 12/28/23 1501     (approximate)   History   Hip Pain  HPI  Kaitlyn Zimmerman is a 38 y.o. female with a past medical history of diabetes presents to the ED with complaint of sudden right LE weakness with onset of last night with stated one episode of urinating on herself. She reports feeling numbness and pain to her lower back and loss of sensation to bilateral legs described as frost bite sensation. She denies falls or injuries. She notes she was unable to get up this morning because she could not feel her right leg. She states it took me 5 hours to get up with the help of my son. She has never experienced this before.     Physical Exam   Triage Vital Signs: ED Triage Vitals [12/28/23 1336]  Encounter Vitals Group     BP (!) 127/90     Girls Systolic BP Percentile      Girls Diastolic BP Percentile      Boys Systolic BP Percentile      Boys Diastolic BP Percentile      Pulse Rate 97     Resp 17     Temp 98 F (36.7 C)     Temp Source Oral     SpO2 100 %     Weight 265 lb (120.2 kg)     Height 5' 9 (1.753 m)     Head Circumference      Peak Flow      Pain Score 10     Pain Loc      Pain Education      Exclude from Growth Chart     Most recent vital signs: Vitals:   12/28/23 1518 12/28/23 1826  BP:  106/68  Pulse:  84  Resp:  18  Temp:  98.3 F (36.8 C)  SpO2: 100% 100%   General: Alert and oriented. INAD.   Head:  NCAT.  Eyes:  PERRLA. EOMI.  CV:  Good peripheral perfusion.   RESP:  Normal effort.  BACK:  Spinous process is midline without deformity. Patient states loss of sensation to palpation to lumbar region bilaterally MSK:   Full ROM in all joints. No swelling, deformity or tenderness.  NEURO: Cranial nerves intact. Speech is clear. Abnormal sensation to LE - decreased. Patient unable to perceive accurate touch  sensation on LE.   Normal muscle strength of UE & LE. Gait is unsteady .  Patient is only able to take a couple of steps favoring her right side.  Unable to assess if this is due to patient's lack of will.  DRE performed; Rectal tone present   ED Results / Procedures / Treatments   Labs (all labs ordered are listed, but only abnormal results are displayed) Labs Reviewed - No data to display  RADIOLOGY  I personally viewed and evaluated these images as part of my medical decision making, as well as reviewing the written report by the radiologist.  MR LUMBAR SPINE WO CONTRAST Result Date: 12/28/2023 EXAM: MRI LUMBAR SPINE 12/28/2023 05:49:19 PM TECHNIQUE: Multiplanar multisequence MRI of the lumbar spine was performed without the administration of intravenous contrast. COMPARISON: None available. CLINICAL HISTORY: Low back pain, cauda equina syndrome suspected. Pt sts that she has been having numbness in her hands and feet however pt sts that last night she started to have  pain in her right hip. FINDINGS: BONES AND ALIGNMENT: Normal alignment. Normal vertebral body heights. Bone marrow signal is unremarkable. SPINAL CORD: The conus terminates normally. SOFT TISSUES: No paraspinal mass. L1-L2: No significant disc herniation. No spinal canal stenosis or neural foraminal narrowing. L2-L3: No significant disc herniation. No spinal canal stenosis or neural foraminal narrowing. L3-L4: No significant disc herniation. No spinal canal stenosis or neural foraminal narrowing. L4-L5: Left subarticular and foraminal disc protrusion results in moderate left subarticular recess stenosis and moderate left foraminal stenosis. Patent central canal and right foramen. L5-S1: Central disc protrusion. Mild bilateral facet arthropathy. Mild bilateral foraminal stenosis. No significant canal stenosis. IMPRESSION: 1. At L4-L5, moderate left subarticular recess and left foraminal stenosis. 2. At L5-S1, mild bilateral foraminal  stenosis. Electronically signed by: Gilmore Molt MD 12/28/2023 06:12 PM EDT RP Workstation: [id number]    PROCEDURES:  Critical Care performed: No  Procedures   MEDICATIONS ORDERED IN ED: Medications  ketorolac  (TORADOL ) 30 MG/ML injection 30 mg (30 mg Intramuscular Given 12/28/23 1600)  cyclobenzaprine (FLEXERIL) tablet 5 mg (5 mg Oral Given 12/28/23 1607)  acetaminophen  (TYLENOL ) tablet 650 mg (650 mg Oral Given 12/28/23 1602)     IMPRESSION / MDM / ASSESSMENT AND PLAN / ED COURSE  I reviewed the triage vital signs and the nursing notes.                             Clinical Course as of 12/28/23 2117  Thu Dec 28, 2023  1819 MR LUMBAR SPINE WO CONTRAST IMPRESSION: 1. At L4-L5, moderate left subarticular recess and left foraminal stenosis. 2. At L5-S1, mild bilateral foraminal stenosis.   [MH]    Clinical Course User Index [MH] Margrette Monte A, PA-C   38 y.o. female presents to the emergency department for evaluation and treatment of Acute right leg weakness. See HPI for further details.   Differential diagnosis includes, but is not limited to radiculopathy, neuropathy, paresthesias,  Patient's presentation is most consistent with acute presentation with potential threat to life or bodily function.  Due to abnormal neuroexam and history of loss of bladder control, I do have suspicions for possible cord compression.  Discussed case with supervising physician, Dr. Waymond who is in agreement to further workup with MRI of the lumbar.  MRI results updated to patient.  Advised close follow-up with neurosurgery for further evaluation.  She verbalized understanding.  She is in stable condition for discharge home.  ED return precautions discussed  FINAL CLINICAL IMPRESSION(S) / ED DIAGNOSES   Final diagnoses:  Weakness of right lower extremity   Rx / DC Orders   ED Discharge Orders          Ordered    cyclobenzaprine (FLEXERIL) 5 MG tablet  3 times daily PRN         12/28/23 1855            Note:  This document was prepared using Dragon voice recognition software and may include unintentional dictation errors.    Margrette, Lelend Heinecke A, PA-C 12/28/23 2120    Waymond Lorelle Cummins, MD 12/29/23 [phone]

## 2023-12-29 ENCOUNTER — Telehealth: Payer: Self-pay | Admitting: Neurosurgery

## 2023-12-29 ENCOUNTER — Other Ambulatory Visit (HOSPITAL_BASED_OUTPATIENT_CLINIC_OR_DEPARTMENT_OTHER): Payer: Self-pay

## 2023-12-29 NOTE — Telephone Encounter (Signed)
 Patient left a voicemail to schedule a ER follow up. Patient's chart reviewed by clinical staff and Danielle recommended she follow up with a PA.   Mailbox full, unable to leave voicemail.

## 2024-01-08 ENCOUNTER — Other Ambulatory Visit (HOSPITAL_BASED_OUTPATIENT_CLINIC_OR_DEPARTMENT_OTHER): Payer: Self-pay

## 2024-01-10 ENCOUNTER — Encounter (HOSPITAL_BASED_OUTPATIENT_CLINIC_OR_DEPARTMENT_OTHER): Payer: Self-pay

## 2024-01-10 ENCOUNTER — Other Ambulatory Visit (HOSPITAL_BASED_OUTPATIENT_CLINIC_OR_DEPARTMENT_OTHER): Payer: Self-pay

## 2024-01-10 ENCOUNTER — Other Ambulatory Visit: Payer: Self-pay | Admitting: General Practice

## 2024-01-10 DIAGNOSIS — Z1231 Encounter for screening mammogram for malignant neoplasm of breast: Secondary | ICD-10-CM

## 2024-01-17 ENCOUNTER — Ambulatory Visit

## 2024-01-17 DIAGNOSIS — Z1231 Encounter for screening mammogram for malignant neoplasm of breast: Secondary | ICD-10-CM

## 2024-01-26 NOTE — Progress Notes (Signed)
 Referring Physician:  No referring provider defined for this encounter.  Primary Physician:  Pcp, No  History of Present Illness: 02/06/2024 [name] has a history of DM, Bell's palsy, elevated LFTs.   Seen in ED on 12/28/23 for sudden right leg weakness and one episode of urinary incontinence.   She has constant numbness from above her knees down into her toes bilaterally with intermittent electrical shocks. She has constant numbness in both hands with intermittent electrical shocks. Numbness started back in July. This is her primary complaint.   She has constant mid to lower back pain with numbness in her back x 1-2 months. She has intermittent right anterior leg pain from above knee to mid calf. She has weakness in both legs. Pain is worse with any movement.   No neck pain. No radicular arm pain.   Was given flexeril  by ED. This has not helped. No relief with motrin /tylenol .   Tobacco use: Does not smoke.   Bowel/Bladder Dysfunction: none  Conservative measures:  Physical therapy:  has not participated in Multimodal medical therapy including regular antiinflammatories:  Flexeril , Ibuprofen  Injections: no epidural steroid injections  Past Surgery: no spine surgery  The symptoms are causing a significant impact on the patient's life.   Review of Systems:  A 10 point review of systems is negative, except for the pertinent positives and negatives detailed in the HPI.  Past Medical History: Past Medical History:  Diagnosis Date   Bell's palsy    Diabetes mellitus without complication (HCC)     Past Surgical History: Past Surgical History:  Procedure Laterality Date   CHOLECYSTECTOMY      Allergies: Allergies as of 02/06/2024   (No Known Allergies)    Medications: Outpatient Encounter Medications as of 02/06/2024  Medication Sig   cyclobenzaprine  (FLEXERIL ) 5 MG tablet Take 1 tablet (5 mg total) by mouth 3 (three) times daily as needed.   hydrOXYzine  (VISTARIL) 25 MG capsule TAKE ONE CAPSULE BY MOUTH TWICE A DAY AS NEEDED FOR AGITATION AND/OR SLEEP   LORazepam  (ATIVAN ) 0.5 MG tablet Take 1 tablet (0.5 mg total) by mouth daily as needed for anxiety.   [DISCONTINUED] atorvastatin (LIPITOR) 40 MG tablet Take 40 mg by mouth daily.   [DISCONTINUED] insulin NPH-regular Human (HUMULIN 70/30) (70-30) 100 UNIT/ML injection Inject 20 Units into the skin 2 (two) times daily with a meal.   [DISCONTINUED] Insulin Syringe-Needle U-100 (B-D INS SYR MICROFINE 1CC/27G) 27G X 5/8 1 ML MISC 1 each by Other route.   [DISCONTINUED] losartan (COZAAR) 25 MG tablet Take 25 mg by mouth daily.   [DISCONTINUED] metFORMIN (GLUCOPHAGE) 500 MG tablet Take 500 mg by mouth 2 (two) times daily with a meal.   [DISCONTINUED] oxymetazoline (AFRIN) 0.05 % nasal spray 2-3 sprays in each nostril q12h x 3 days. No more than 3 days to avoid rebound congestion!   [DISCONTINUED] pioglitazone (ACTOS) 30 MG tablet Take 30 mg by mouth daily.   [DISCONTINUED] terbinafine (LAMISIL) 1 % cream Apply to affected area twice daily until 1 week after resolution   [DISCONTINUED] bacitracin  ointment Apply 1 Application topically 2 (two) times daily.   [DISCONTINUED] ibuprofen  (ADVIL ,MOTRIN ) 600 MG tablet Take 1 tablet (600 mg total) by mouth every 6 (six) hours as needed.   [DISCONTINUED] methylPREDNISolone  (MEDROL  DOSEPAK) 4 MG TBPK tablet Take 24 mg q 12h on day 1, 20 mg q 12h on day 2, 16 mg q 12h on day 3, 12 mg q 12h on day 4, 8  mg q 12h on day 5, 4 mg q 12h on day 6.  Take all tablets in each row at once, take with food.   [DISCONTINUED] nystatin  (MYCOSTATIN /NYSTOP ) powder Apply 1 Application topically 3 (three) times daily.   No facility-administered encounter medications on file as of 02/06/2024.    Social History: Social History   Tobacco Use   Smoking status: Never   Smokeless tobacco: Never  Vaping Use   Vaping status: Never Used  Substance Use Topics   Alcohol use: Yes     Comment: occasional   Drug use: No    Family Medical History: Family History  Adopted: Yes    Physical Examination: Vitals:   02/06/24 0906  BP: 132/88    General: Patient is well developed, well nourished, calm, collected, and in no apparent distress. Attention to examination is appropriate.  Respiratory: Patient is breathing without any difficulty.   NEUROLOGICAL:     Awake, alert, oriented to person, place, and time.  Speech is clear and fluent. Fund of knowledge is appropriate.   Cranial Nerves: Pupils equal round and reactive to light.  Facial tone is symmetric.    No posterior cervical tenderness. No tenderness in bilateral trapezial region.   Mild lower posterior lumbar tenderness.   No abnormal lesions on exposed skin.   Strength: Side Biceps Triceps Deltoid Interossei Grip Wrist Ext. Wrist Flex.  R 5 5 5 5 5 5 5   L 5 5 5 5 5 5 5    Side Iliopsoas Quads Hamstring PF DF EHL  R 5 5 5 5 5 5   L 5 5 5 5 5 5    Reflexes are 1+ and symmetric at the biceps, brachioradialis, patella and achilles.   Hoffman's is absent.  Clonus is not present.   Bilateral upper and lower extremity sensation is intact to light touch, but diminished in bilateral hands and also diminished in bilateral legs from knees to feet.   Gait is slow.     Medical Decision Making  Imaging: Lumbar MRI dated 12/28/23:  FINDINGS:   BONES AND ALIGNMENT: Normal alignment. Normal vertebral body heights. Bone marrow signal is unremarkable.   SPINAL CORD: The conus terminates normally.   SOFT TISSUES: No paraspinal mass.   L1-L2: No significant disc herniation. No spinal canal stenosis or neural foraminal narrowing.   L2-L3: No significant disc herniation. No spinal canal stenosis or neural foraminal narrowing.   L3-L4: No significant disc herniation. No spinal canal stenosis or neural foraminal narrowing.   L4-L5: Left subarticular and foraminal disc protrusion results in moderate  left subarticular recess stenosis and moderate left foraminal stenosis. Patent central canal and right foramen.   L5-S1: Central disc protrusion. Mild bilateral facet arthropathy. Mild bilateral foraminal stenosis. No significant canal stenosis.   IMPRESSION: 1. At L4-L5, moderate left subarticular recess and left foraminal stenosis. 2. At L5-S1, mild bilateral foraminal stenosis.   Electronically signed by: Gilmore Molt MD 12/28/2023 06:12 PM EDT RP Workstation: [id number]      I have personally reviewed the images and agree with the above interpretation.  Assessment and Plan: Kaitlyn Zimmerman has constant numbness from above her knees down into her toes bilaterally with intermittent electrical shocks. She has constant numbness in both hands with intermittent electrical shocks. Numbness started back in July. This is her primary complaint.   She also has constant mid to lower back pain with numbness in her back x 1-2 months. She has intermittent right anterior leg pain from above knee to  mid calf. She has weakness in both legs.   No neck pain. No radicular arm pain.   She has known lumbar spondylosis with left foraminal stenosis at L4-L5 and mild bilateral foraminal stenosis L5-S1. This may explain her back pain, but would not explain numbness.   Treatment options discussed with patient and following plan made:   - EMG of bilateral upper and lower extremities to further evaluate numbness. Lumbar MRI findings would not explain numbness in legs. Orders to [facility].  - Trial of neurontin  300mg  q hs. Reviewed dosing and side effects. Consider increasing to bid if helpful and well tolerated.  - Order for physical therapy for lumbar spine to [facility] in [place].  - Referral to PMR at [facility] to discuss possible lumbar injections.  - Will follow up once she had EMGs done to review them.   I spent a total of 35 minutes in face-to-face and non-face-to-face activities related to this  patient's care today including review of outside records, review of imaging, review of symptoms, physical exam, discussion of differential diagnosis, discussion of treatment options, and documentation.   Thank you for involving me in the care of this patient.   Glade Boys PA-C Dept. of Neurosurgery

## 2024-01-29 ENCOUNTER — Ambulatory Visit: Payer: Self-pay | Admitting: General Practice

## 2024-01-29 DIAGNOSIS — Z7689 Persons encountering health services in other specified circumstances: Secondary | ICD-10-CM

## 2024-01-29 DIAGNOSIS — Z91199 Patient's noncompliance with other medical treatment and regimen due to unspecified reason: Secondary | ICD-10-CM

## 2024-01-29 NOTE — Progress Notes (Signed)
 Patient did not show for appointment today.

## 2024-02-06 ENCOUNTER — Ambulatory Visit: Admitting: Orthopedic Surgery

## 2024-02-06 ENCOUNTER — Encounter: Payer: Self-pay | Admitting: Orthopedic Surgery

## 2024-02-06 VITALS — BP 132/88 | Ht 69.0 in | Wt 258.5 lb

## 2024-02-06 DIAGNOSIS — M48061 Spinal stenosis, lumbar region without neurogenic claudication: Secondary | ICD-10-CM

## 2024-02-06 DIAGNOSIS — M5416 Radiculopathy, lumbar region: Secondary | ICD-10-CM

## 2024-02-06 DIAGNOSIS — M4726 Other spondylosis with radiculopathy, lumbar region: Secondary | ICD-10-CM | POA: Diagnosis not present

## 2024-02-06 DIAGNOSIS — M47816 Spondylosis without myelopathy or radiculopathy, lumbar region: Secondary | ICD-10-CM

## 2024-02-06 DIAGNOSIS — R200 Anesthesia of skin: Secondary | ICD-10-CM | POA: Diagnosis not present

## 2024-02-06 MED ORDER — GABAPENTIN 300 MG PO CAPS: 300.0000 mg | ORAL_CAPSULE | Freq: Every day | ORAL | 0 refills | Status: AC

## 2024-02-06 NOTE — Patient Instructions (Signed)
 It was so nice to see you today. Thank you so much for coming in.    You have some wear and tear in your back at L4-L5 and L5-S1. This may be causing your back pain and possibly the right leg pain, but I don't think it is causing the numbness in your legs.   I sent physical therapy orders to [facility] in [place]. You can call them at number below if you don't hear from them to schedule your visit.   I sent a prescription for gabapentin  to help with pain and numbness. Take at night as directed. Most common side effects are feeling sleepy, dizzy, or drunk. Call me if any issues. If you tolerate this dose, let me know and we can increase if needed.   I want you to see physical medicine and rehab at the [facility] to discuss possible injections in your lower back. Dr. Avanell, Dr. Dodson, and their NP [place] are great and will take good care of you. They should call you to schedule an appointment or you can call them at [phone].   I want to get an EMG (nerve conduction test) of your arms and legs to look into the numbness further. I have ordered this and [facility] will call you to schedule. You can also call them at [phone].   Will plan to see you back once you have EMG tests done. Please do not hesitate to call if you have any questions or concerns. You can also message me in MyChart.   Glade Boys PA-C [phone]     The physicians and staff at [facility] at [facility] are committed to providing excellent care. You may receive a survey asking for feedback about your experience at our office. We value you your feedback and appreciate you taking the time to to fill it out. The [facility] leadership team is also available to discuss your experience in person, feel free to contact us  [phone].

## 2024-02-07 ENCOUNTER — Other Ambulatory Visit: Payer: Self-pay

## 2024-02-07 DIAGNOSIS — R202 Paresthesia of skin: Secondary | ICD-10-CM

## 2024-02-09 ENCOUNTER — Ambulatory Visit: Admitting: Neurology

## 2024-02-09 DIAGNOSIS — R202 Paresthesia of skin: Secondary | ICD-10-CM | POA: Diagnosis not present

## 2024-02-09 NOTE — Procedures (Signed)
 [facility] Neurology  [address] [place], [address]  [place], [place] [address] Tel: [phone] Fax: [phone] Test Date:  02/09/2024  Patient: Kaitlyn Zimmerman DOB: 02/25/1986 Physician: Tonita Blanch, DO  Sex: Female Height: 5' 9 Ref Phys: Glade Boys, DEVONNA  ID#: [id number]   Technician:    History: This is a 38 year old female referred for evaluation of bilateral lower extremity numbness.  NCV & EMG Findings: Electrodiagnostic testing of the right lower extremity and additional studies of the left shows: Bilateral sural and superficial peroneal sensory responses are within normal limits. Bilateral peroneal and tibial motor responses are within normal limits. Bilateral tibial H reflex studies are within normal limits. There is no evidence of active or chronic motor axonal changes affecting any of the tested muscles.  Motor unit configuration and recruitment pattern is within normal limits.  Impression: This is a normal study of the lower extremities.  In particular, there is no evidence of a large fiber sensorimotor polyneuropathy or lumbosacral radiculopathy.    ___________________________ Tonita Blanch, DO    Nerve Conduction Studies   Stim Site NR Peak (ms) Norm Peak (ms) O-P Amp (V) Norm O-P Amp  Left Sup Peroneal Anti Sensory (Ant Lat Mall)  32 C  12 cm    2.6 <4.5 6.0 >5  Right Sup Peroneal Anti Sensory (Ant Lat Mall)  32 C  12 cm    2.8 <4.5 5.8 >5  Left Sural Anti Sensory (Lat Mall)  32 C  Calf    2.6 <4.5 9.1 >5  Right Sural Anti Sensory (Lat Mall)  32 C  Calf    2.4 <4.5 8.4 >5     Stim Site NR Onset (ms) Norm Onset (ms) O-P Amp (mV) Norm O-P Amp Site1 Site2 Delta-0 (ms) Dist (cm) Vel (m/s) Norm Vel (m/s)  Left Peroneal Motor (Ext Dig Brev)  32 C  Ankle    3.8 <5.5 4.6 >3 B Fib Ankle 8.9 41.0 46 >40  B Fib    12.7  3.9  Poplt B Fib 1.8 9.0 50 >40  Poplt    14.5  3.8         Right Peroneal Motor (Ext Dig Brev)  32 C  Ankle    3.7 <5.5 3.6 >3 B Fib  Ankle 9.0 41.0 46 >40  B Fib    12.7  3.3  Poplt B Fib 1.8 9.0 50 >40  Poplt    14.5  3.2         Left Tibial Motor (Abd Hall Brev)  32 C  Ankle    3.9 <6.0 10.7 >8 Knee Ankle 10.2 45.0 44 >40  Knee    14.1  8.3         Right Tibial Motor (Abd Hall Brev)  32 C  Ankle    4.8 <6.0 9.2 >8 Knee Ankle 9.3 45.0 48 >40  Knee    14.1  5.7          Electromyography   Side Muscle Ins.Act Fibs Fasc Recrt Amp Dur Poly Activation Comment  Right AntTibialis Nml Nml Nml Nml Nml Nml Nml Nml N/A  Right Gastroc Nml Nml Nml Nml Nml Nml Nml Nml N/A  Right Flex Dig Long Nml Nml Nml Nml Nml Nml Nml Nml N/A  Right RectFemoris Nml Nml Nml Nml Nml Nml Nml Nml N/A  Right GluteusMed Nml Nml Nml Nml Nml Nml Nml Nml N/A  Left AntTibialis Nml Nml Nml Nml Nml Nml Nml Nml N/A  Left  Gastroc Nml Nml Nml Nml Nml Nml Nml Nml N/A  Left Flex Dig Long Nml Nml Nml Nml Nml Nml Nml Nml N/A  Left RectFemoris Nml Nml Nml Nml Nml Nml Nml Nml N/A  Left GluteusMed Nml Nml Nml Nml Nml Nml Nml Nml N/A      Waveforms:

## 2024-02-11 NOTE — Progress Notes (Unsigned)
 My Chart Video Visit- Progress Note: Referring Physician:  No referring provider defined for this encounter.  Primary Physician:  Pcp, No  This visit was performed via MyChart/video.   Patient location: home Provider location: ***  I spent a total of *** minutes non-face-to-face activities for this visit on the date of this encounter including review of current clinical condition and response to treatment.    Patient has given verbal consent to this MyChart video visit and we reviewed the limitations of a MyChart video visit. Patient wishes to proceed.    Chief Complaint:  ***  History of Present Illness: Kaitlyn Zimmerman is a 38 y.o. female has a history of  DM, Bell's palsy, elevated LFTs.   Last seen by me on 02/06/24 for constant numbness from above her knees down into her toes bilaterally with intermittent electrical shocks. She has constant numbness in both hands with intermittent electrical shocks. Numbness started back in July. This is her primary complaint.    She also has constant mid to lower back pain with numbness in her back x 1-2 months. She has intermittent right anterior leg pain from above knee to mid calf. She has weakness in both legs.    No neck pain. No radicular arm pain.   She has known lumbar spondylosis with left foraminal stenosis at L4-L5 and mild bilateral foraminal stenosis L5-S1. This may explain her back pain, but would not explain numbness.   MyChart visit scheduled to review her lower extremity EMG. Upper extremity EMG scheduled for 04/13/23.   She was also started on neurontin  and sent to PT for lumbar spine.            Seen in ED on 12/28/23 for sudden right leg weakness and one episode of urinary incontinence.    She has constant numbness from above her knees down into her toes bilaterally with intermittent electrical shocks. She has constant numbness in both hands with intermittent electrical shocks. Numbness started back in July. This  is her primary complaint.    She has constant mid to lower back pain with numbness in her back x 1-2 months. She has intermittent right anterior leg pain from above knee to mid calf. She has weakness in both legs. Pain is worse with any movement.    No neck pain. No radicular arm pain.    Was given flexeril  by ED. This has not helped. No relief with motrin /tylenol .    Tobacco use: Does not smoke.    Bowel/Bladder Dysfunction: none   Conservative measures:  Physical therapy:  has not participated in Multimodal medical therapy including regular antiinflammatories:  Flexeril , Ibuprofen  Injections: no epidural steroid injections   Past Surgery: no spine surgery   The symptoms are causing a significant impact on the patient's life.    Exam: General: Patient is well developed, well nourished, calm, collected, and in no apparent distress. Attention to examination is appropriate.  Respiratory: Patient is breathing without any difficulty.    Awake, alert, oriented to person, place, and time.  Speech is clear and fluent. Fund of knowledge is appropriate.   ***    Upper extremity EMG dated 02/09/24:  Impression: This is a normal study of the lower extremities.  In particular, there is no evidence of a large fiber sensorimotor polyneuropathy or lumbosacral radiculopathy.       ___________________________ Tonita Blanch, DO  I have personally reviewed the images and agree with the above interpretation.  Assessment and Plan: [name] has constant  numbness from above her knees down into her toes bilaterally with intermittent electrical shocks. She has constant numbness in both hands with intermittent electrical shocks. Numbness started back in July. This is her primary complaint.    She also has constant mid to lower back pain with numbness in her back x 1-2 months. She has intermittent right anterior leg pain from above knee to mid calf. She has weakness in both legs.    No neck  pain. No radicular arm pain.    She has known lumbar spondylosis with left foraminal stenosis at L4-L5 and mild bilateral foraminal stenosis L5-S1. This may explain her back pain, but would not explain numbness.    Treatment options discussed with patient and following plan made:    - EMG of bilateral upper and lower extremities to further evaluate numbness. Lumbar MRI findings would not explain numbness in legs. Orders to [facility].  - Trial of neurontin  300mg  q hs. Reviewed dosing and side effects. Consider increasing to bid if helpful and well tolerated.  - Order for physical therapy for lumbar spine to [facility] in [place].  - Referral to PMR at [facility] to discuss possible lumbar injections.  - Will follow up once she had EMGs done to review them.   Glade Boys PA-C Neurosurgery

## 2024-02-16 ENCOUNTER — Telehealth: Admitting: Orthopedic Surgery

## 2024-02-16 ENCOUNTER — Encounter: Payer: Self-pay | Admitting: Orthopedic Surgery

## 2024-02-16 DIAGNOSIS — M5416 Radiculopathy, lumbar region: Secondary | ICD-10-CM

## 2024-02-16 DIAGNOSIS — M48061 Spinal stenosis, lumbar region without neurogenic claudication: Secondary | ICD-10-CM

## 2024-02-16 DIAGNOSIS — R200 Anesthesia of skin: Secondary | ICD-10-CM | POA: Diagnosis not present

## 2024-02-16 DIAGNOSIS — M4726 Other spondylosis with radiculopathy, lumbar region: Secondary | ICD-10-CM

## 2024-02-16 DIAGNOSIS — M47816 Spondylosis without myelopathy or radiculopathy, lumbar region: Secondary | ICD-10-CM

## 2024-02-21 ENCOUNTER — Ambulatory Visit: Admitting: Physical Therapy

## 2024-02-23 ENCOUNTER — Other Ambulatory Visit: Payer: Self-pay | Admitting: Medical Genetics

## 2024-03-04 NOTE — Therapy (Deleted)
 " OUTPATIENT PHYSICAL THERAPY THORACOLUMBAR EVALUATION   Patient Name: Kaitlyn Zimmerman MRN: [id number] DOB:Aug 06, 1985, 38 y.o., female Today's Date: 03/04/2024  END OF SESSION:   Past Medical History:  Diagnosis Date   Bell's palsy    Diabetes mellitus without complication (HCC)    Past Surgical History:  Procedure Laterality Date   CHOLECYSTECTOMY     Patient Active Problem List   Diagnosis Date Noted   Type 2 diabetes mellitus without complication, without long-term current use of insulin (HCC) 10/01/2019   Elevated liver enzymes 05/25/2017   Bell's palsy 09/09/2013   Facial pain 09/09/2013    PCP: Pcp, No   REFERRING PROVIDER: Hilma Hastings, PA-C  REFERRING DIAG: [phone] (ICD-10-CM) - Lumbar spondylosis M54.16 (ICD-10-CM) - Lumbar radiculopathy R20.0,R20.2 (ICD-10-CM) - Numbness and tingling of both legs R20.0,R20.2 (ICD-10-CM) - Numbness and tingling in both hands  Rationale for Evaluation and Treatment: Rehabilitation  THERAPY DIAG:  No diagnosis found.  ONSET DATE: July 2025  SUBJECTIVE:                                                                                                                                                                                           SUBJECTIVE STATEMENT: Kaitlyn Zimmerman has constant numbness from above her knees down into her toes bilaterally with intermittent electrical shocks. She has constant numbness in both hands with intermittent electrical shocks. Numbness started back in July. This is her primary complaint.    She also has constant mid to lower back pain with numbness in her back x 1-2 months. She has intermittent right anterior leg pain from above knee to mid calf. She has weakness in both legs.    No neck pain. No radicular arm pain.    She has known lumbar spondylosis with left foraminal stenosis at L4-L5 and mild bilateral foraminal stenosis L5-S1. This may explain her back pain, but would not explain numbness.    PERTINENT HISTORY:  [name] has a history of DM, Bell's palsy, elevated LFTs.    Seen in ED on 12/28/23 for sudden right leg weakness and one episode of urinary incontinence.    She has constant numbness from above her knees down into her toes bilaterally with intermittent electrical shocks. She has constant numbness in both hands with intermittent electrical shocks. Numbness started back in July. This is her primary complaint.    She has constant mid to lower back pain with numbness in her back x 1-2 months. She has intermittent right anterior leg pain from above knee to mid calf. She has weakness in both legs. Pain is worse with any movement.  No neck pain. No radicular arm pain.   PAIN:  Are you having pain? Yes: NPRS scale: *** Pain location: low back Pain description: *** Aggravating factors: *** Relieving factors: ***  PRECAUTIONS: None  RED FLAGS: None   WEIGHT BEARING RESTRICTIONS: No  FALLS:  Has patient fallen in last 6 months? No  OCCUPATION: retail  PLOF: Independent  PATIENT GOALS: To manage my back pain  NEXT MD VISIT: 04/18/24  OBJECTIVE:  Note: Objective measures were completed at Evaluation unless otherwise noted.  DIAGNOSTIC FINDINGS:  IMPRESSION: 1. At L4-L5, moderate left subarticular recess and left foraminal stenosis. 2. At L5-S1, mild bilateral foraminal stenosis.   Electronically signed by: Gilmore Molt MD 12/28/2023 06:12 PM EDT RP Workstation: [id number]  PATIENT SURVEYS:  Modified Oswestry:   Interpretation of scores: Score Category Description  0-20% Minimal Disability The patient can cope with most living activities. Usually no treatment is indicated apart from advice on lifting, sitting and exercise  21-40% Moderate Disability The patient experiences more pain and difficulty with sitting, lifting and standing. Travel and social life are more difficult and they may be disabled from work. Personal care, sexual  activity and sleeping are not grossly affected, and the patient can usually be managed by conservative means  41-60% Severe Disability Pain remains the main problem in this group, but activities of daily living are affected. These patients require a detailed investigation  61-80% Crippled Back pain impinges on all aspects of the patients life. Positive intervention is required  81-100% Bed-bound These patients are either bed-bound or exaggerating their symptoms  Bluford FORBES Zoe DELENA Karon DELENA, et al. Surgery versus conservative management of stable thoracolumbar fracture: the PRESTO feasibility RCT. [place] ([place]): [employer]; 2021 Nov. Gov [facility] Technology Assessment, No. 25.62.) Appendix 3, Oswestry Disability Index category descriptors. Available from: Findjewelers.cz  Minimally Clinically Important Difference (MCID) = 12.8%    MUSCLE LENGTH: Hamstrings: Right *** deg; Left *** deg Debby test: Right *** deg; Left *** deg  POSTURE: {posture:25561}  PALPATION: deferred  LUMBAR ROM:   AROM eval  Flexion   Extension   Right lateral flexion   Left lateral flexion   Right rotation   Left rotation    (Blank rows = not tested)  LOWER EXTREMITY ROM:     {AROM/PROM:27142}  Right eval Left eval  Hip flexion    Hip extension    Hip abduction    Hip adduction    Hip internal rotation    Hip external rotation    Knee flexion    Knee extension    Ankle dorsiflexion    Ankle plantarflexion    Ankle inversion    Ankle eversion     (Blank rows = not tested)  LOWER EXTREMITY MMT:    MMT Right eval Left eval  Hip flexion    Hip extension    Hip abduction    Hip adduction    Hip internal rotation    Hip external rotation    Knee flexion    Knee extension    Ankle dorsiflexion    Ankle plantarflexion    Ankle inversion    Ankle eversion     (Blank rows = not tested)  LUMBAR SPECIAL TESTS:  Straight leg raise test:  {pos/neg:25243} and Slump test: {pos/neg:25243}  FUNCTIONAL TESTS:  30 seconds chair stand test  GAIT: Distance walked: 34ftx2 Assistive device utilized: {Assistive devices:23999} Level of assistance: {Levels of assistance:24026} Comments: ***  TREATMENT:  [facility] Adult PT Treatment:                                                DATE: *** Eval and HEP Self Care: Additional minutes spent for educating on updated Therapeutic Home Exercise Program as well as comparing current status to condition at start of symptoms. This included exercises focusing on stretching, strengthening, with focus on eccentric aspects. Long term goals include an improvement in range of motion, strength, endurance as well as avoiding reinjury. Patient's frequency would include in 1-2 times a day, 3-5 times a week for a duration of 6-12 weeks. Proper technique shown and discussed handout in great detail. All questions were discussed and addressed.      PATIENT EDUCATION:  Education details: Discussed eval findings, rehab rationale and POC and patient is in agreement  Person educated: Patient Education method: Explanation and Handouts Education comprehension: verbalized understanding and needs further education  HOME EXERCISE PROGRAM: ***  ASSESSMENT:  CLINICAL IMPRESSION: Patient is a 38 y.o. female who was seen today for physical therapy evaluation and treatment for chronic low back pain.   OBJECTIVE IMPAIRMENTS: {opptimpairments:25111}.   ACTIVITY LIMITATIONS: {activitylimitations:27494}  PERSONAL FACTORS: Behavior pattern, Fitness, Past/current experiences, Time since onset of injury/illness/exacerbation, and 1 comorbidity: DM are also affecting patient's functional outcome.   REHAB POTENTIAL: Fair due to chronicity, body habitus and DM  CLINICAL DECISION MAKING: Evolving/moderate  complexity  EVALUATION COMPLEXITY: Moderate   GOALS: Goals reviewed with patient? No  SHORT TERM GOALS: Target date: ***  Patient to demonstrate independence in HEP  Baseline: Goal status: INITIAL  2.  *** Baseline:  Goal status: INITIAL  3.  *** Baseline:  Goal status: INITIAL  4.  *** Baseline:  Goal status: INITIAL  5.  *** Baseline:  Goal status: INITIAL  6.  *** Baseline:  Goal status: INITIAL  LONG TERM GOALS: Target date: ***  Patient will increase 30s chair stand reps from *** to *** with/without arms to demonstrate and improved functional ability with less pain/difficulty as well as reduce fall risk.  Baseline:  Goal status: INITIAL  2.  Patient will score at least ***% on FOTO to signify clinically meaningful improvement in functional abilities.   Baseline:  Goal status: INITIAL  3.  Patient will acknowledge ***/10 pain at least once during episode of care   Baseline:  Goal status: INITIAL  4.  *** Baseline:  Goal status: INITIAL  5.  *** Baseline:  Goal status: INITIAL  6.  *** Baseline:  Goal status: INITIAL  PLAN:  PT FREQUENCY: 1-2x/week  PT DURATION: 6 weeks  PLANNED INTERVENTIONS: 97110-Therapeutic exercises, 97530- Therapeutic activity, [id number]- Neuromuscular re-education, 97535- Self Care, 02859- Manual therapy, [phone]- Gait training, Patient/Family education, Balance training, and Stair training.  PLAN FOR NEXT SESSION: HEP review and update, manual techniques as appropriate, aerobic tasks, ROM and flexibility activities, strengthening and PREs, TPDN, gait and balance training,aquatic therapy, modalities for pain and NMRE      Brayn Eckstein M Lakie Mclouth, PT 03/04/2024, 11:20 AM  "

## 2024-03-05 ENCOUNTER — Ambulatory Visit

## 2024-03-21 ENCOUNTER — Ambulatory Visit: Admitting: Neurology

## 2024-03-21 ENCOUNTER — Encounter: Payer: Self-pay | Admitting: Neurology

## 2024-03-21 DIAGNOSIS — G629 Polyneuropathy, unspecified: Secondary | ICD-10-CM

## 2024-03-21 DIAGNOSIS — R202 Paresthesia of skin: Secondary | ICD-10-CM | POA: Diagnosis not present

## 2024-03-21 NOTE — Procedures (Signed)
 " [facility]rology  [address] [place], [address]  [place], [place] [address] Tel: [phone] Fax: [phone] Test Date:  03/21/2024  Patient: Kaitlyn Zimmerman DOB: 1985/08/16 Physician: Tonita Blanch, DO  Sex: Female Height: 5' 9 Ref Phys: Glade Boys, DEVONNA  ID#: [id number]   Technician:    History: This is a 39 year old female referred for evaluation of bilateral upper extremity paresthesias.  NCV & EMG Findings: Extensive electrodiagnostic testing of the right upper extremity and additional studies of the left shows:  Bilateral median and ulnar sensory responses show prolonged latency (L3.6, R4.1, L3.4, R3.3 ms) and reduced amplitude (L10.8, R9.1, L11.5, R10.1 V).  Bilateral radial sensory responses showed reduced amplitude (L14.1, R14.7 V).   Bilateral median and ulnar motor responses are within normal limits. Mild increased insertional activity is seen in the first dorsal interosseous and pronator teres muscles bilaterally.  Otherwise, there is no evidence of active or chronic motor axon loss changes and motor unit configuration is within normal limits.  Impression: The electrophysiologic findings are most consistent with a sensory polyneuropathy with demyelinating and axonal features, affecting bilateral upper extremities.   ___________________________ Tonita Blanch, DO    Nerve Conduction Studies   Stim Site NR Peak (ms) Norm Peak (ms) O-P Amp (V) Norm O-P Amp  Left Median Anti Sensory (2nd Digit)  32 C  Wrist    *3.6 <3.4 *10.8 >20  Right Median Anti Sensory (2nd Digit)  32 C  Wrist    *4.1 <3.4 *9.1 >20  Left Radial Anti Sensory (Base 1st Digit)  32 C  Wrist    1.6 <2.7 *14.1 >18  Right Radial Anti Sensory (Base 1st Digit)  32 C  Wrist    2.0 <2.7 *14.7 >18  Left Ulnar Anti Sensory (5th Digit)  32 C  Wrist    *3.4 <3.1 *11.5 >12  Right Ulnar Anti Sensory (5th Digit)  32 C  Wrist    *3.3 <3.1 *10.1 >12     Stim Site NR Onset (ms) Norm Onset (ms) O-P  Amp (mV) Norm O-P Amp Site1 Site2 Delta-0 (ms) Dist (cm) Vel (m/s) Norm Vel (m/s)  Left Median Motor (Abd Poll Brev)  32 C  Wrist    3.4 <3.9 10.1 >6 Elbow Wrist 5.7 29.0 51 >50  Elbow    9.1  10.2         Right Median Motor (Abd Poll Brev)  32 C  Wrist    3.8 <3.9 10.5 >6 Elbow Wrist 5.7 30.0 53 >50  Elbow    9.5  10.4         Left Ulnar Motor (Abd Dig Minimi)  32 C  Wrist    2.6 <3.1 9.8 >7 B Elbow Wrist 4.1 21.0 51 >50  B Elbow    6.7  9.4  A Elbow B Elbow 1.7 10.0 59 >50  A Elbow    8.4  9.5         Right Ulnar Motor (Abd Dig Minimi)  32 C  Wrist    2.8 <3.1 8.4 >7 B Elbow Wrist 4.1 22.0 54 >50  B Elbow    6.9  7.7  A Elbow B Elbow 1.8 10.0 56 >50  A Elbow    8.7  7.3          Electromyography   Side Muscle Ins.Act Fibs Fasc Recrt Amp Dur Poly Activation Comment  Right 1stDorInt *1+ Nml Nml Nml Nml Nml Nml Nml N/A  Right Abd Poll Brev  Nml Nml Nml Nml Nml Nml Nml Nml N/A  Right PronatorTeres Nml Nml Nml Nml Nml Nml Nml Nml N/A  Right Biceps *1+ Nml Nml Nml Nml Nml Nml Nml N/A  Right Triceps Nml Nml Nml Nml Nml Nml Nml Nml N/A  Right Deltoid Nml Nml Nml Nml Nml Nml Nml Nml N/A  Left Cervical Parasp Low Nml Nml Nml Nml Nml Nml Nml Nml N/A  Left 1stDorInt *1+ Nml Nml Nml Nml Nml Nml Nml N/A  Left Abd Poll Brev Nml Nml Nml Nml Nml Nml Nml Nml N/A  Left PronatorTeres *1+ Nml Nml Nml Nml Nml Nml Nml N/A  Left Biceps Nml Nml Nml Nml Nml Nml Nml Nml N/A  Left Triceps Nml Nml Nml Nml Nml Nml Nml Nml N/A  Left Deltoid Nml Nml Nml Nml Nml Nml Nml Nml N/A      Waveforms:                         "

## 2024-03-23 ENCOUNTER — Other Ambulatory Visit

## 2024-03-26 ENCOUNTER — Other Ambulatory Visit: Payer: Self-pay | Admitting: Medical Genetics

## 2024-03-26 ENCOUNTER — Telehealth

## 2024-03-26 DIAGNOSIS — Z006 Encounter for examination for normal comparison and control in clinical research program: Secondary | ICD-10-CM

## 2024-03-26 NOTE — Therapy (Unsigned)
 " OUTPATIENT PHYSICAL THERAPY THORACOLUMBAR EVALUATION   Patient Name: Kaitlyn Zimmerman MRN: [id number] DOB:02/03/1986, 39 y.o., female Today's Date: 03/26/2024  END OF SESSION:   Past Medical History:  Diagnosis Date   Bell's palsy    Diabetes mellitus without complication (HCC)    Past Surgical History:  Procedure Laterality Date   CHOLECYSTECTOMY     Patient Active Problem List   Diagnosis Date Noted   Type 2 diabetes mellitus without complication, without long-term current use of insulin (HCC) 10/01/2019   Elevated liver enzymes 05/25/2017   Bell's palsy 09/09/2013   Facial pain 09/09/2013    PCP: Pcp, No   REFERRING PROVIDER: Hilma Hastings, PA-C  REFERRING DIAG: [phone] (ICD-10-CM) - Lumbar spondylosis M54.16 (ICD-10-CM) - Lumbar radiculopathy R20.0,R20.2 (ICD-10-CM) - Numbness and tingling of both legs R20.0,R20.2 (ICD-10-CM) - Numbness and tingling in both hands  Rationale for Evaluation and Treatment: Rehabilitation  THERAPY DIAG:  No diagnosis found.  ONSET DATE: chronic  SUBJECTIVE:                                                                                                                                                                                           SUBJECTIVE STATEMENT: Kaitlyn Zimmerman has constant numbness from above her knees down into her toes bilaterally with intermittent electrical shocks. She has constant numbness in both hands with intermittent electrical shocks. Numbness started back in July. This is her primary complaint.    She also has constant mid to lower back pain with numbness in her back x 1-2 months. She has intermittent right anterior leg pain from above knee to mid calf. She has weakness in both legs.    No neck pain. No radicular arm pain.    She has known lumbar spondylosis with left foraminal stenosis at L4-L5 and mild bilateral foraminal stenosis L5-S1. This may explain her back pain, but would not explain numbness.   PERTINENT  HISTORY:  02/06/2024 Kaitlyn Zimmerman has a history of DM, Bell's palsy, elevated LFTs.    Seen in ED on 12/28/23 for sudden right leg weakness and one episode of urinary incontinence.    She has constant numbness from above her knees down into her toes bilaterally with intermittent electrical shocks. She has constant numbness in both hands with intermittent electrical shocks. Numbness started back in July. This is her primary complaint.    She has constant mid to lower back pain with numbness in her back x 1-2 months. She has intermittent right anterior leg pain from above knee to mid calf. She has weakness in both legs. Pain is worse with any movement.  No neck pain. No radicular arm pain.    Was given flexeril  by ED. This has not helped. No relief with motrin /tylenol .   PAIN:  Are you having pain? Yes: NPRS scale: *** Pain location: *** Pain description: *** Aggravating factors: *** Relieving factors: ***  PRECAUTIONS: None  RED FLAGS: None   WEIGHT BEARING RESTRICTIONS: No  FALLS:  Has patient fallen in last 6 months? No  OCCUPATION: [employer]  PLOF: Independent  PATIENT GOALS: To manage my back pan  NEXT MD VISIT: ***  OBJECTIVE:  Note: Objective measures were completed at Evaluation unless otherwise noted.  DIAGNOSTIC FINDINGS:  IMPRESSION: 1. At L4-L5, moderate left subarticular recess and left foraminal stenosis. 2. At L5-S1, mild bilateral foraminal stenosis.   Electronically signed by: Gilmore Molt MD 12/28/2023 06:12 PM EDT RP Workstation: [id number]  PATIENT SURVEYS:  Oswestry Low Back Pain Disability Questionnaire:  OSWESTRY LBP DISABILITY Questionnaire  Date: *** Score  Pain intensity {ODQ1:33821}  2. Personal care (washing, dressing, etc.) {ODQ2:33822}  3. Lifting {ODQ3:33823}  4. Walking {ODQ4:33824}  5. Sitting {ODQ5:33825}  6. Standing {ODQ6:33826}  7. Sleeping {ODQ7:33827}  8. Sex life (if applicable) {ODQ8:33828}  9. Social  life {NIV0:66170}  10. Traveling {NIV89:66169}  Total ***/50   Interpretation of scores: Score Category Description  0-20% Minimal Disability The patient can cope with most living activities. Usually no treatment is indicated apart from advice on lifting, sitting and exercise  21-40% Moderate Disability The patient experiences more pain and difficulty with sitting, lifting and standing. Travel and social life are more difficult and they may be disabled from work. Personal care, sexual activity and sleeping are not grossly affected, and the patient can usually be managed by conservative means  41-60% Severe Disability Pain remains the main problem in this group, but activities of daily living are affected. These patients require a detailed investigation  61-80% Crippled Back pain impinges on all aspects of the patients life. Positive intervention is required  81-100% Bed-bound These patients are either bed-bound or exaggerating their symptoms  Reference: Adelle SPEAK, [place] PB. The Oswestry Disability Index. Spine 2000 Nov 15;25(22):2940-52; discussion 60.  Minimum detectable change (90% confidence): 10% points     MUSCLE LENGTH: Hamstrings: Right *** deg; Left *** deg Debby test: Right *** deg; Left *** deg  POSTURE: {posture:25561}  PALPATION: ***  LUMBAR ROM:   AROM eval  Flexion   Extension   Right lateral flexion   Left lateral flexion   Right rotation   Left rotation    (Blank rows = not tested)  LOWER EXTREMITY ROM:     {AROM/PROM:27142}  Right eval Left eval  Hip flexion    Hip extension    Hip abduction    Hip adduction    Hip internal rotation    Hip external rotation    Knee flexion    Knee extension    Ankle dorsiflexion    Ankle plantarflexion    Ankle inversion    Ankle eversion     (Blank rows = not tested)  LOWER EXTREMITY MMT:    MMT Right eval Left eval  Hip flexion    Hip extension    Hip abduction    Hip adduction    Hip internal  rotation    Hip external rotation    Knee flexion    Knee extension    Ankle dorsiflexion    Ankle plantarflexion    Ankle inversion    Ankle eversion     (Blank  rows = not tested)  LUMBAR SPECIAL TESTS:  Straight leg raise test: {pos/neg:25243} and Slump test: {pos/neg:25243}  FUNCTIONAL TESTS:  30 seconds chair stand test  GAIT: Distance walked: 7ftx2 Assistive device utilized: None Level of assistance: Complete Independence Comments: ***  TREATMENT DATE: ***                                                                                                                                 PATIENT EDUCATION:  Education details: Discussed eval findings, rehab rationale and POC and patient is in agreement  Person educated: Patient Education method: Explanation and Handouts Education comprehension: verbalized understanding and needs further education  HOME EXERCISE PROGRAM: ***  ASSESSMENT:  CLINICAL IMPRESSION: Patient is a *** y.o. *** who was seen today for physical therapy evaluation and treatment for ***.   OBJECTIVE IMPAIRMENTS: {opptimpairments:25111}.   ACTIVITY LIMITATIONS: {activitylimitations:27494}   PERSONAL FACTORS: {Personal factors:25162} are also affecting patient's functional outcome.   REHAB POTENTIAL: Good  CLINICAL DECISION MAKING: Evolving/moderate complexity  EVALUATION COMPLEXITY: Moderate   GOALS: Goals reviewed with patient? No  SHORT TERM GOALS: Target date: ***  Patient to demonstrate independence in HEP  Baseline: Goal status: INITIAL  2.  *** Baseline:  Goal status: INITIAL  3.  *** Baseline:  Goal status: INITIAL  4.  *** Baseline:  Goal status: INITIAL  5.  *** Baseline:  Goal status: INITIAL  6.  *** Baseline:  Goal status: INITIAL  LONG TERM GOALS: Target date: ***  Patient will acknowledge ***/10 pain at least once during episode of care   Baseline:  Goal status: INITIAL  2.  Patient will score at  least ***% on FOTO to signify clinically meaningful improvement in functional abilities.   Baseline:  Goal status: INITIAL  3.  Patient will increase 30s chair stand reps from *** to *** with/without arms to demonstrate and improved functional ability with less pain/difficulty as well as reduce fall risk.  Baseline:  Goal status: INITIAL  4.  *** Baseline:  Goal status: INITIAL  5.  *** Baseline:  Goal status: INITIAL  6.  *** Baseline:  Goal status: INITIAL  PLAN:  PT FREQUENCY: 1-2x/week  PT DURATION: 6 weeks  PLANNED INTERVENTIONS: 97110-Therapeutic exercises, 97530- Therapeutic activity, [id number]- Neuromuscular re-education, 97535- Self Care, [id number]- Manual therapy, and Patient/Family education.  PLAN FOR NEXT SESSION: HEP review and update, manual techniques as appropriate, aerobic tasks, ROM and flexibility activities, strengthening and PREs, TPDN, gait and balance training,aquatic therapy, modalities for pain and NMRE      Florena Kozma M Hafiz Irion, PT 03/26/2024, 1:22 PM  "

## 2024-03-28 ENCOUNTER — Other Ambulatory Visit: Payer: Self-pay | Admitting: General Practice

## 2024-03-28 DIAGNOSIS — Z1231 Encounter for screening mammogram for malignant neoplasm of breast: Secondary | ICD-10-CM

## 2024-03-29 ENCOUNTER — Ambulatory Visit: Attending: Orthopedic Surgery

## 2024-03-29 ENCOUNTER — Encounter

## 2024-03-29 DIAGNOSIS — Z1231 Encounter for screening mammogram for malignant neoplasm of breast: Secondary | ICD-10-CM

## 2024-04-08 ENCOUNTER — Ambulatory Visit: Payer: Self-pay | Admitting: Neurology

## 2024-04-12 ENCOUNTER — Encounter: Admitting: Neurology

## 2024-04-18 ENCOUNTER — Ambulatory Visit: Admitting: Orthopedic Surgery

## 2024-05-20 ENCOUNTER — Ambulatory Visit: Admitting: Neurology
